# Patient Record
Sex: Female | Born: 1960 | Race: Black or African American | Hispanic: No | Marital: Single | Smoking: Former smoker
Health system: Southern US, Community
[De-identification: ages and names within clinical notes are randomized; demographics above are authoritative.]

## PROBLEM LIST (undated history)

## (undated) DIAGNOSIS — E785 Hyperlipidemia, unspecified: Secondary | ICD-10-CM

## (undated) DIAGNOSIS — I10 Essential (primary) hypertension: Secondary | ICD-10-CM

## (undated) DIAGNOSIS — B019 Varicella without complication: Secondary | ICD-10-CM

## (undated) DIAGNOSIS — L732 Hidradenitis suppurativa: Secondary | ICD-10-CM

## (undated) DIAGNOSIS — D649 Anemia, unspecified: Secondary | ICD-10-CM

## (undated) HISTORY — DX: Essential (primary) hypertension: I10

## (undated) HISTORY — DX: Hyperlipidemia, unspecified: E78.5

## (undated) HISTORY — DX: Varicella without complication: B01.9

## (undated) HISTORY — DX: Hidradenitis suppurativa: L73.2

## (undated) HISTORY — PX: COLONOSCOPY W/ BIOPSIES AND POLYPECTOMY: SHX1376

## (undated) HISTORY — DX: Anemia, unspecified: D64.9

---
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
## 2004-09-11 ENCOUNTER — Other Ambulatory Visit: Admission: RE | Admit: 2004-09-11 | Discharge: 2004-09-11 | Payer: Self-pay | Admitting: Internal Medicine

## 2005-02-24 ENCOUNTER — Encounter: Admission: RE | Admit: 2005-02-24 | Discharge: 2005-02-24 | Payer: Self-pay | Admitting: Internal Medicine

## 2006-12-10 ENCOUNTER — Other Ambulatory Visit: Admission: RE | Admit: 2006-12-10 | Discharge: 2006-12-10 | Payer: Self-pay | Admitting: Internal Medicine

## 2009-02-05 ENCOUNTER — Other Ambulatory Visit: Admission: RE | Admit: 2009-02-05 | Discharge: 2009-02-05 | Payer: Self-pay | Admitting: Internal Medicine

## 2010-05-17 ENCOUNTER — Other Ambulatory Visit: Admission: RE | Admit: 2010-05-17 | Discharge: 2010-05-17 | Payer: Self-pay | Admitting: Internal Medicine

## 2010-05-24 ENCOUNTER — Encounter: Admission: RE | Admit: 2010-05-24 | Discharge: 2010-05-24 | Payer: Self-pay | Admitting: Internal Medicine

## 2010-05-29 ENCOUNTER — Encounter: Admission: RE | Admit: 2010-05-29 | Discharge: 2010-05-29 | Payer: Self-pay | Admitting: Internal Medicine

## 2010-11-26 ENCOUNTER — Encounter: Admission: RE | Admit: 2010-11-26 | Discharge: 2010-11-26 | Payer: Self-pay | Admitting: Internal Medicine

## 2011-06-06 ENCOUNTER — Other Ambulatory Visit: Payer: Self-pay | Admitting: Internal Medicine

## 2011-06-06 DIAGNOSIS — N63 Unspecified lump in unspecified breast: Secondary | ICD-10-CM

## 2011-06-18 ENCOUNTER — Ambulatory Visit
Admission: RE | Admit: 2011-06-18 | Discharge: 2011-06-18 | Disposition: A | Discharge status: Home / Self Care | Payer: 59 | Source: Ambulatory Visit | Attending: Internal Medicine | Admitting: Internal Medicine

## 2011-06-18 DIAGNOSIS — N63 Unspecified lump in unspecified breast: Secondary | ICD-10-CM

## 2011-10-22 ENCOUNTER — Encounter: Payer: Self-pay | Admitting: Family Medicine

## 2011-10-22 ENCOUNTER — Ambulatory Visit (INDEPENDENT_AMBULATORY_CARE_PROVIDER_SITE_OTHER): Payer: 59 | Admitting: Family Medicine

## 2011-10-22 DIAGNOSIS — L732 Hidradenitis suppurativa: Secondary | ICD-10-CM

## 2011-10-22 DIAGNOSIS — E785 Hyperlipidemia, unspecified: Secondary | ICD-10-CM

## 2011-10-22 DIAGNOSIS — R030 Elevated blood-pressure reading, without diagnosis of hypertension: Secondary | ICD-10-CM

## 2011-10-22 MED ORDER — HYDROCHLOROTHIAZIDE 12.5 MG PO CAPS: 12.5000 mg | ORAL_CAPSULE | Freq: Every day | ORAL | Status: DC

## 2011-10-22 NOTE — Progress Notes (Signed)
  Subjective:    Patient ID: Julie Holder, female    DOB: 1960-12-24, 50 y.o.   MRN: [id number]  HPI  New patient to establish care. Patient works as a Agricultural engineer at [employer]. She has medical history significant for recently elevated blood pressure, hyperlipidemia, and recurrent problems with hidradenitis suppurativa. She takes Crestor for hyperlipidemia. Never treated medically for elevated blood pressure. She uses minocycline and topical clindamycin for her recurrent hidradenitis problems. She has hyperlipidemia but no history of peripheral vascular disease or CAD. Quit smoking 2009.  Denies any recent headaches. No dizziness. No chest pain. Recent blood pressures ranging 140-160 systolic and 80-90 diastolic. No regular exercise. Long history of obesity.  Patient is single. She has one child and one grandchild. Quit smoking as above. No alcohol. Works as a Agricultural engineer.  Family history significant for mother with hypertension, type 2 diabetes, chronic kidney disease and history of CHF. Brother with type 2 diabetes.  Past Medical History  Diagnosis Date  . Chicken pox   . Hypertension   . Hyperlipidemia   . Hidradenitis suppurativa    Past Surgical History  Procedure Date  . Hand surgery 1975    reports that she quit smoking about 2 years ago. Her smoking use included Cigarettes. She has a 9 pack-year smoking history. She does not have any smokeless tobacco history on file. Her alcohol and drug histories not on file. family history includes Diabetes in her brother and mother; Heart disease in her mother; Hyperlipidemia in her brother, mother, and sister; Hypertension in her mother; and Kidney disease in her mother. Allergies  Allergen Reactions  . Amoxicillin   . Aspirin   . Fish Allergy     Shell fish  . Penicillins     hives      Review of Systems  Constitutional: Negative for fever, chills, activity change, appetite change and unexpected weight change.    HENT: Negative for ear pain and sore throat.   Eyes: Negative for visual disturbance.  Respiratory: Negative for cough, shortness of breath and wheezing.   Cardiovascular: Negative for chest pain, palpitations and leg swelling.  Gastrointestinal: Negative for nausea, vomiting, abdominal pain, diarrhea, constipation and blood in stool.  Genitourinary: Negative for dysuria.  Musculoskeletal: Negative for back pain.  Skin: Negative for rash.  Neurological: Negative for dizziness, syncope and headaches.  Hematological: Negative for adenopathy.  Psychiatric/Behavioral: Negative for dysphoric mood.       Objective:   Physical Exam  Constitutional: She is oriented to person, place, and time. She appears well-developed and well-nourished.  HENT:  Right Ear: External ear normal.  Left Ear: External ear normal.  Mouth/Throat: Oropharynx is clear and moist.  Neck: Neck supple. No thyromegaly present.  Cardiovascular: Normal rate, regular rhythm and normal heart sounds.   Pulmonary/Chest: Effort normal and breath sounds normal. No respiratory distress. She has no wheezes. She has no rales.  Musculoskeletal: She exhibits no edema.  Lymphadenopathy:    She has no cervical adenopathy.  Neurological: She is alert and oriented to person, place, and time.  Skin: No rash noted.  Psychiatric: She has a normal mood and affect. Her behavior is normal.          Assessment & Plan:  #1 hypertension. New diagnosis. Initiate HCTZ 12.5 mg daily. Potassium rich diet. Sodium reduction. Work on weight loss. Reassess approximately 6 weeks #2 hyperlipidemia. Continue Crestor.  #3 history of recurrent hidradenitis suppurativa

## 2011-10-22 NOTE — Patient Instructions (Signed)
Work on weight loss and more consistent exercise.  Hypertension As your heart beats, it forces blood through your arteries. This force is your blood pressure. If the pressure is too high, it is called hypertension (HTN) or high blood pressure. HTN is dangerous because you may have it and not know it. High blood pressure may mean that your heart has to work harder to pump blood. Your arteries may be narrow or stiff. The extra work puts you at risk for heart disease, stroke, and other problems.  Blood pressure consists of two numbers, a higher number over a lower, 110/72, for example. It is stated as "110 over 72." The ideal is below 120 for the top number (systolic) and under 80 for the bottom (diastolic). Write down your blood pressure today. You should pay close attention to your blood pressure if you have certain conditions such as:  Heart failure.   Prior heart attack.   Diabetes   Chronic kidney disease.   Prior stroke.   Multiple risk factors for heart disease.  To see if you have HTN, your blood pressure should be measured while you are seated with your arm held at the level of the heart. It should be measured at least twice. A one-time elevated blood pressure reading (especially in the Emergency Department) does not mean that you need treatment. There may be conditions in which the blood pressure is different between your right and left arms. It is important to see your caregiver soon for a recheck. Most people have essential hypertension which means that there is not a specific cause. This type of high blood pressure may be lowered by changing lifestyle factors such as:  Stress.   Smoking.   Lack of exercise.   Excessive weight.   Drug/tobacco/alcohol use.   Eating less salt.  Most people do not have symptoms from high blood pressure until it has caused damage to the body. Effective treatment can often prevent, delay or reduce that damage. TREATMENT  When a cause has been  identified, treatment for high blood pressure is directed at the cause. There are a large number of medications to treat HTN. These fall into several categories, and your caregiver will help you select the medicines that are best for you. Medications may have side effects. You should review side effects with your caregiver. If your blood pressure stays high after you have made lifestyle changes or started on medicines,   Your medication(s) may need to be changed.   Other problems may need to be addressed.   Be certain you understand your prescriptions, and know how and when to take your medicine.   Be sure to follow up with your caregiver within the time frame advised (usually within two weeks) to have your blood pressure rechecked and to review your medications.   If you are taking more than one medicine to lower your blood pressure, make sure you know how and at what times they should be taken. Taking two medicines at the same time can result in blood pressure that is too low.  SEEK IMMEDIATE MEDICAL CARE IF:  You develop a severe headache, blurred or changing vision, or confusion.   You have unusual weakness or numbness, or a faint feeling.   You have severe chest or abdominal pain, vomiting, or breathing problems.  MAKE SURE YOU:   Understand these instructions.   Will watch your condition.   Will get help right away if you are not doing well or get worse.  Document Released: 12/08/2005 Document Revised: 08/20/2011 Document Reviewed: 07/28/2008 Exit[employer] Patient Information 2012 [employer]nd more consistent exercise.

## 2011-11-07 ENCOUNTER — Encounter: Payer: Self-pay | Admitting: Family Medicine

## 2011-12-24 ENCOUNTER — Encounter: Payer: Self-pay | Admitting: Family Medicine

## 2011-12-24 ENCOUNTER — Ambulatory Visit (INDEPENDENT_AMBULATORY_CARE_PROVIDER_SITE_OTHER): Payer: 59 | Admitting: Family Medicine

## 2011-12-24 VITALS — BP 150/80 | Temp 98.4°F | Wt 260.0 lb

## 2011-12-24 DIAGNOSIS — E785 Hyperlipidemia, unspecified: Secondary | ICD-10-CM

## 2011-12-24 DIAGNOSIS — R252 Cramp and spasm: Secondary | ICD-10-CM

## 2011-12-24 DIAGNOSIS — R030 Elevated blood-pressure reading, without diagnosis of hypertension: Secondary | ICD-10-CM

## 2011-12-24 LAB — BASIC METABOLIC PANEL
BUN: 21 mg/dL (ref 6–23)
CO2: 26 mEq/L (ref 19–32)
Chloride: 100 mEq/L (ref 96–112)
Creatinine, Ser: 0.9 mg/dL (ref 0.4–1.2)
Potassium: 3.7 mEq/L (ref 3.5–5.1)

## 2011-12-24 MED ORDER — LISINOPRIL-HYDROCHLOROTHIAZIDE 10-12.5 MG PO TABS: 1.0000 | ORAL_TABLET | Freq: Every day | ORAL | Status: DC

## 2011-12-24 NOTE — Patient Instructions (Signed)
Continue with weight loss efforts.  

## 2011-12-24 NOTE — Progress Notes (Signed)
  Subjective:    Patient ID: Julie Holder, female    DOB: April 18, 1961, 51 y.o.   MRN: [id number]  HPI   Patient's history of obesity, hypertension, hyperlipidemia, and hidradenitis. Continues to see dermatologist. They're considering use of Accutane. Maj. issue is frequent muscle cramps mostly at night. Takes hydrochlorothiazide for hypertension. No recent lab work. Patient concerned this is due to Crestor. No myalgias or arthralgias. Drinks plenty of fluids. Frequent intake of fruit. No claudication symptoms.  Patient also has had prior intolerance of Lipitor with myalgias.  Past Medical History  Diagnosis Date  . Chicken pox   . Hypertension   . Hyperlipidemia   . Hidradenitis suppurativa    Past Surgical History  Procedure Date  . Hand surgery 1975    reports that she quit smoking about 2 years ago. Her smoking use included Cigarettes. She has a 9 pack-year smoking history. She does not have any smokeless tobacco history on file. Her alcohol and drug histories not on file. family history includes Diabetes in her brother and mother; Heart disease in her mother; Hyperlipidemia in her brother, mother, and sister; Hypertension in her mother; and Kidney disease in her mother. Allergies  Allergen Reactions  . Amoxicillin   . Aspirin   . Fish Allergy     Shell fish  . Penicillins     hives      Review of Systems  Constitutional: Negative for fever, chills, appetite change and unexpected weight change.  Respiratory: Negative for cough and shortness of breath.   Cardiovascular: Negative for chest pain, palpitations and leg swelling.  Gastrointestinal: Negative for abdominal pain.  Neurological: Negative for dizziness and headaches.       Objective:   Physical Exam  Constitutional: She appears well-developed and well-nourished. No distress.  Neck: Neck supple.  Cardiovascular: Normal rate and regular rhythm.   Pulmonary/Chest: Effort normal and breath sounds normal. No  respiratory distress. She has no wheezes. She has no rales.  Musculoskeletal: She exhibits no edema.  Lymphadenopathy:    She has no cervical adenopathy.  Psychiatric: She has a normal mood and affect.          Assessment & Plan:  #1 hypertension poorly controlled. Change HCTZ to lisinopril HCTZ 10/12.5 one daily and reassess blood pressure in 2 months #2 frequent leg cramps. Check basic metabolic panel and magnesium level. If normal, consider holding Crestor for now #3 hyperlipidemia.  Doubt leg cramps related. If labs normal consider holding Crestor for one month to see his symptoms resolve. She has already been intolerant to Lipitor

## 2011-12-25 NOTE — Progress Notes (Signed)
Quick Note:  Left a message for pt to return call. ______ 

## 2011-12-26 NOTE — Progress Notes (Signed)
Quick Note:  Labs mailed to pt home, also VM left with recommendations ______

## 2012-01-12 ENCOUNTER — Encounter: Payer: Self-pay | Admitting: *Deleted

## 2012-01-12 ENCOUNTER — Encounter: Payer: 59 | Attending: Family Medicine | Admitting: *Deleted

## 2012-01-12 DIAGNOSIS — E669 Obesity, unspecified: Secondary | ICD-10-CM | POA: Insufficient documentation

## 2012-01-12 DIAGNOSIS — Z713 Dietary counseling and surveillance: Secondary | ICD-10-CM | POA: Insufficient documentation

## 2012-01-12 DIAGNOSIS — I10 Essential (primary) hypertension: Secondary | ICD-10-CM | POA: Insufficient documentation

## 2012-01-12 DIAGNOSIS — E785 Hyperlipidemia, unspecified: Secondary | ICD-10-CM | POA: Insufficient documentation

## 2012-01-12 NOTE — Patient Instructions (Addendum)
Plan: Continue with choosing lean meats prepared without added fat  Continue with multiple choices of vegetables and fruits / day Aim for 3 Carb Choices per meal, 0-1 choice per snack if hungry Read Food Labels for Total Carbohydrate and Fat grams of foods Look for opportunities to increase activity level as tolerated on days off work

## 2012-01-12 NOTE — Progress Notes (Signed)
  Medical Nutrition Therapy:  Appt start time: 1200 end time:  1300.   Assessment:  Primary concerns today: Patient states she has hypertension and would like to lose weight. 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 She states she has a problem with her sweat glands that flares up more when she is over tired, so she tries to rest more on her days off.   MEDICATIONS: see list   DIETARY INTAKE:  Usual eating pattern includes 3 meals and 1-2 snacks per day.  Everyday foods include good variety of all food groups.  Avoided foods include fried and high fat foods now and most sweets.    24-hr recall:  B ( AM): was eating bacon egg and cheese to: oats, banana and small orange Snk ( AM): fresh fruit  L ( PM): salad with raspberry walnut dressing and lean meat, OR sandwich ([employer]) white bread, lite mayo, water  Snk ( PM):  no D ( PM): weaning off of fried foods to 1/week, meat, 2 vegetables, sweet vs white potato, oleo with cinnamon and Splenda Snk ( PM): every other night, snack with grand daughter Beverages: water, coffee w/ non fat creamer and tea w/ honey or Equal,   Usual physical activity:  Moves a lot at work 3 days/week, not much on days off. Has problem with infected sweat glands so needs to rest if gets too tired. Does house and yard work as needed.  Estimated energy needs: 1400 calories 158 g carbohydrates 105 g protein 39 g fat  Progress Towards Goal(s):  In progress.   Nutritional Diagnosis:  NI-1.5 Excessive energy intake As related to weight management.  As evidenced by BMI of 43.2.    Intervention:  Nutrition counseling on basic nutrition, relationship of macro-nutrients to calorie and . Plan: Continue with choosing lean meats prepared without added fat  Continue with multiple choices of vegetables and fruits / day Aim for 3 Carb Choices per meal, 0-1 choice per snack if hungry Read Food  Labels for Total Carbohydrate and Fat grams of foods Look for opportunities to increase activity level as tolerated on days off work  Handouts given during visit include:  Carb Counting and Label Reading handouts  Menu Planning Card  Carb counting and Meal Planning Booklet by [employer]  Monitoring/Evaluation:  Dietary intake, exercise, reading food labels, and body weight in 1 month(s).

## 2012-02-20 ENCOUNTER — Encounter: Payer: Self-pay | Admitting: Family Medicine

## 2012-02-20 ENCOUNTER — Ambulatory Visit (INDEPENDENT_AMBULATORY_CARE_PROVIDER_SITE_OTHER): Payer: 59 | Admitting: Family Medicine

## 2012-02-20 DIAGNOSIS — R030 Elevated blood-pressure reading, without diagnosis of hypertension: Secondary | ICD-10-CM

## 2012-02-20 DIAGNOSIS — E785 Hyperlipidemia, unspecified: Secondary | ICD-10-CM

## 2012-02-20 MED ORDER — PRAVASTATIN SODIUM 20 MG PO TABS: 20.0000 mg | ORAL_TABLET | Freq: Every day | ORAL | Status: DC

## 2012-02-20 NOTE — Progress Notes (Signed)
  Subjective:    Patient ID: Tana Felts, female    DOB: 21-Jun-1961, 51 y.o.   MRN: [id number]  HPI  Followup hypertension. Blood pressure greatly improved with lisinopril HCTZ combination. No dizziness or side effects.   On potassium rich diet.  Muscle cramps and aches resolved after she stopped Crestor. She's had no muscle aches whatsoever. She has also had previous intolerance to Lipitor.   Review of Systems  Constitutional: Negative for fatigue.  Eyes: Negative for visual disturbance.  Respiratory: Negative for cough, chest tightness, shortness of breath and wheezing.   Cardiovascular: Negative for chest pain, palpitations and leg swelling.  Neurological: Negative for dizziness, seizures, syncope, weakness, light-headedness and headaches.       Objective:   Physical Exam  Constitutional: She appears well-developed and well-nourished.  Cardiovascular: Normal rate and regular rhythm.   Pulmonary/Chest: Effort normal and breath sounds normal. No respiratory distress. She has no wheezes. She has no rales.  Musculoskeletal: She exhibits no edema.          Assessment & Plan:  #1 hypertension improved continue current medication. Check basic metabolic panel followup in 2 months #2 muscle cramps improved and resolved off Crestor. Change to pravastatin 20 mg daily and if tolerating- lipid panel and hepatic panel in 2 months

## 2012-04-21 ENCOUNTER — Other Ambulatory Visit (INDEPENDENT_AMBULATORY_CARE_PROVIDER_SITE_OTHER): Payer: 59

## 2012-04-21 DIAGNOSIS — R030 Elevated blood-pressure reading, without diagnosis of hypertension: Secondary | ICD-10-CM

## 2012-04-21 DIAGNOSIS — E785 Hyperlipidemia, unspecified: Secondary | ICD-10-CM

## 2012-04-21 LAB — HEPATIC FUNCTION PANEL
ALT: 16 U/L (ref 0–35)
Bilirubin, Direct: 0 mg/dL (ref 0.0–0.3)
Total Bilirubin: 0.4 mg/dL (ref 0.3–1.2)
Total Protein: 8.5 g/dL — ABNORMAL HIGH (ref 6.0–8.3)

## 2012-04-21 LAB — LIPID PANEL
Cholesterol: 193 mg/dL (ref 0–200)
HDL: 25.4 mg/dL — ABNORMAL LOW (ref >39.00)
LDL Cholesterol: 148 mg/dL — ABNORMAL HIGH (ref 0–99)
VLDL: 19.4 mg/dL (ref 0.0–40.0)

## 2012-04-21 LAB — BASIC METABOLIC PANEL
BUN: 29 mg/dL — ABNORMAL HIGH (ref 6–23)
Chloride: 102 mEq/L (ref 96–112)
GFR: 65.87 mL/min (ref >60.00)
Potassium: 4.4 mEq/L (ref 3.5–5.1)
Sodium: 136 mEq/L (ref 135–145)

## 2012-04-22 NOTE — Progress Notes (Signed)
Quick Note:  Pt informed ______ 

## 2012-06-23 ENCOUNTER — Other Ambulatory Visit: Payer: Self-pay | Admitting: Family Medicine

## 2012-06-23 DIAGNOSIS — Z1231 Encounter for screening mammogram for malignant neoplasm of breast: Secondary | ICD-10-CM

## 2012-07-09 ENCOUNTER — Ambulatory Visit
Admission: RE | Admit: 2012-07-09 | Discharge: 2012-07-09 | Disposition: A | Discharge status: Home / Self Care | Payer: 59 | Source: Ambulatory Visit | Attending: Family Medicine | Admitting: Family Medicine

## 2012-07-09 DIAGNOSIS — Z1231 Encounter for screening mammogram for malignant neoplasm of breast: Secondary | ICD-10-CM

## 2012-07-12 ENCOUNTER — Ambulatory Visit (INDEPENDENT_AMBULATORY_CARE_PROVIDER_SITE_OTHER): Payer: 59 | Admitting: Family Medicine

## 2012-07-12 ENCOUNTER — Encounter: Payer: Self-pay | Admitting: Family Medicine

## 2012-07-12 VITALS — BP 130/70 | Temp 98.1°F | Wt 247.0 lb

## 2012-07-12 DIAGNOSIS — R209 Unspecified disturbances of skin sensation: Secondary | ICD-10-CM

## 2012-07-12 DIAGNOSIS — R202 Paresthesia of skin: Secondary | ICD-10-CM

## 2012-07-12 NOTE — Patient Instructions (Addendum)
Follow up promptly for any weakness in left lower extremity or any progressive numbness or severe low back pain.

## 2012-07-12 NOTE — Progress Notes (Signed)
  Subjective:    Patient ID: Julie Holder, female    DOB: 1961/06/01, 51 y.o.   MRN: [id number]  HPI  Acute visit. Numbness left lateral knee region for one and one half months. No weakness. No known injury. No low back pain. No other areas of numbness. No aggravating factors. No alleviating factors.  She has history of hidradenitis suppurativa. Followed by dermatology. Recently started on Septra and prednisone and that is clearing up well.   Review of Systems  Constitutional: Negative for fever and chills.  Genitourinary: Negative for dysuria.  Neurological: Positive for numbness. Negative for weakness.       Objective:   Physical Exam  Constitutional: She appears well-developed and well-nourished.  Cardiovascular: Normal rate and regular rhythm.   Pulmonary/Chest: Effort normal and breath sounds normal. No respiratory distress. She has no wheezes. She has no rales.  Musculoskeletal: She exhibits no edema.       No muscle atrophy in lower extremities  Neurological:       Full-strength lower extremities. Symmetric lower extremity reflexes. Subjective mild impairment touch left lateral knee region just lateral and superior to patellar          Assessment & Plan:  Very localized paresthesias left thigh with otherwise nonfocal exam. We discussed other evaluation including MRI or nerve conduction studies at this point she has no weakness and no pain so we've recommended observation. Be in touch for any weakness or progressive pain

## 2012-11-29 ENCOUNTER — Other Ambulatory Visit: Payer: Self-pay | Admitting: Family Medicine

## 2013-02-05 ENCOUNTER — Other Ambulatory Visit: Payer: Self-pay

## 2013-04-15 ENCOUNTER — Ambulatory Visit (INDEPENDENT_AMBULATORY_CARE_PROVIDER_SITE_OTHER): Payer: 59 | Admitting: Family Medicine

## 2013-04-15 ENCOUNTER — Other Ambulatory Visit (HOSPITAL_COMMUNITY)
Admission: RE | Admit: 2013-04-15 | Discharge: 2013-04-15 | Disposition: A | Discharge status: Home / Self Care | Payer: 59 | Source: Ambulatory Visit | Attending: Family Medicine | Admitting: Family Medicine

## 2013-04-15 ENCOUNTER — Encounter: Payer: Self-pay | Admitting: Family Medicine

## 2013-04-15 VITALS — BP 130/68 | HR 72 | Temp 98.4°F | Resp 12 | Ht 65.5 in | Wt 259.0 lb

## 2013-04-15 DIAGNOSIS — Z01419 Encounter for gynecological examination (general) (routine) without abnormal findings: Secondary | ICD-10-CM | POA: Insufficient documentation

## 2013-04-15 DIAGNOSIS — Z23 Encounter for immunization: Secondary | ICD-10-CM

## 2013-04-15 DIAGNOSIS — Z0000 Encounter for general adult medical examination without abnormal findings: Secondary | ICD-10-CM

## 2013-04-15 LAB — HEPATIC FUNCTION PANEL
ALT: 29 U/L (ref 0–35)
AST: 21 U/L (ref 0–37)
Alkaline Phosphatase: 68 U/L (ref 39–117)
Bilirubin, Direct: 0 mg/dL (ref 0.0–0.3)
Total Bilirubin: 0.3 mg/dL (ref 0.3–1.2)

## 2013-04-15 LAB — CBC WITH DIFFERENTIAL/PLATELET
Basophils Relative: 0.5 % (ref 0.0–3.0)
Eosinophils Relative: 2.9 % (ref 0.0–5.0)
MCV: 83.3 fl (ref 78.0–100.0)
Monocytes Absolute: 0.6 10*3/uL (ref 0.1–1.0)
Monocytes Relative: 7.7 % (ref 3.0–12.0)
Neutrophils Relative %: 53 % (ref 43.0–77.0)
Platelets: 366 10*3/uL (ref 150.0–400.0)
RBC: 3.62 Mil/uL — ABNORMAL LOW (ref 3.87–5.11)
WBC: 8.4 10*3/uL (ref 4.5–10.5)

## 2013-04-15 LAB — BASIC METABOLIC PANEL
CO2: 25 mEq/L (ref 19–32)
Calcium: 8.9 mg/dL (ref 8.4–10.5)
Chloride: 103 mEq/L (ref 96–112)
Glucose, Bld: 79 mg/dL (ref 70–99)
Sodium: 135 mEq/L (ref 135–145)

## 2013-04-15 LAB — POCT URINALYSIS DIPSTICK
Bilirubin, UA: NEGATIVE
Blood, UA: NEGATIVE
Ketones, UA: NEGATIVE
Protein, UA: NEGATIVE
Spec Grav, UA: 1.01
pH, UA: 6

## 2013-04-15 LAB — LIPID PANEL
Cholesterol: 229 mg/dL — ABNORMAL HIGH (ref 0–200)
HDL: 30.2 mg/dL — ABNORMAL LOW (ref >39.00)
Total CHOL/HDL Ratio: 8
Triglycerides: 93 mg/dL (ref 0.0–149.0)
VLDL: 18.6 mg/dL (ref 0.0–40.0)

## 2013-04-15 LAB — TSH: TSH: 0.89 u[IU]/mL (ref 0.35–5.50)

## 2013-04-15 NOTE — Patient Instructions (Addendum)
Try to lose some weight. We will call you regarding colonoscopy and pap smear results Continue with yearly mammogram.

## 2013-04-15 NOTE — Progress Notes (Signed)
Subjective:    Patient ID: Julie Holder, female    DOB: 06-Jan-1961, 52 y.o.   MRN: [id number]  HPI Patient here for complete physical She has history of hyperlipidemia, obesity, hypertension. Also has history of recurrent problems with hidradenitis suppurativa. She is seen dermatologist for this. Last mammogram was last summer. Has never had screening colonoscopy. Last Pap smear about 3 years ago. She is due for repeat tetanus. Has not had lab work yet.  Past Medical History  Diagnosis Date  . Chicken pox   . Hypertension   . Hyperlipidemia   . Hidradenitis suppurativa    Past Surgical History  Procedure Laterality Date  . Hand surgery  1975    reports that she quit smoking about 4 years ago. Her smoking use included Cigarettes. She has a 9 pack-year smoking history. She does not have any smokeless tobacco history on file. Her alcohol and drug histories are not on file. family history includes Diabetes in her brother and mother; Heart disease in her mother; Hyperlipidemia in her brother, mother, and sister; Hypertension in her mother; and Kidney disease in her mother. Allergies  Allergen Reactions  . Amoxicillin   . Aspirin   . Claravis (Isotretinoin)     Muscle spasms  . Fish Allergy     Shell fish  . Penicillins     hives      Review of Systems  Constitutional: Negative for fever, activity change, appetite change, fatigue and unexpected weight change.  HENT: Negative for hearing loss, ear pain, sore throat and trouble swallowing.   Eyes: Negative for visual disturbance.  Respiratory: Negative for cough and shortness of breath.   Cardiovascular: Negative for chest pain and palpitations.  Gastrointestinal: Negative for abdominal pain, diarrhea, constipation and blood in stool.  Genitourinary: Negative for dysuria and hematuria.  Musculoskeletal: Negative for myalgias, back pain and arthralgias.  Skin: Negative for rash.  Neurological: Negative for dizziness,  syncope and headaches.  Hematological: Negative for adenopathy.  Psychiatric/Behavioral: Negative for confusion and dysphoric mood.       Objective:   Physical Exam  Constitutional: She is oriented to person, place, and time. She appears well-developed and well-nourished.  HENT:  Head: Normocephalic and atraumatic.  Eyes: EOM are normal. Pupils are equal, round, and reactive to light.  Neck: Normal range of motion. Neck supple. No thyromegaly present.  Cardiovascular: Normal rate, regular rhythm and normal heart sounds.   No murmur heard. Pulmonary/Chest: Breath sounds normal. No respiratory distress. She has no wheezes. She has no rales.  Abdominal: Soft. Bowel sounds are normal. She exhibits no distension and no mass. There is no tenderness. There is no rebound and no guarding.  Genitourinary:  Breasts are symmetric. No mass. Pelvic exam normal external genitalia. Cervix normal in appearance. No vaginal discharge. Pap smear obtained. Bimanual exam difficult because of her obesity. No adnexal masses or tenderness noted.  Musculoskeletal: Normal range of motion. She exhibits no edema.  Lymphadenopathy:    She has no cervical adenopathy.  Neurological: She is alert and oriented to person, place, and time. She displays normal reflexes. No cranial nerve deficit.  Skin: Rash noted.  Patient has several macular hyperpigmented scattered areas across her abdomen and chest wall. No pustules.  Psychiatric: She has a normal mood and affect. Her behavior is normal. Judgment and thought content normal.          Assessment & Plan:  Complete physical. Screening lab work obtained. Tetanus booster given. Schedule screening colonoscopy. Pap smear  obtained. She will continue with yearly mammograms.

## 2013-04-19 ENCOUNTER — Other Ambulatory Visit: Payer: Self-pay | Admitting: *Deleted

## 2013-04-19 DIAGNOSIS — D649 Anemia, unspecified: Secondary | ICD-10-CM

## 2013-04-19 MED ORDER — PRAVASTATIN SODIUM 40 MG PO TABS: 40.0000 mg | ORAL_TABLET | Freq: Every day | ORAL | Status: DC

## 2013-04-19 NOTE — Progress Notes (Signed)
Quick Note:  Pt informed, labs ordered, med increased ______

## 2013-04-20 NOTE — Progress Notes (Signed)
Quick Note:  Pt informed "normal test" on home VM ______

## 2013-04-21 ENCOUNTER — Encounter: Payer: Self-pay | Admitting: Family Medicine

## 2013-04-22 ENCOUNTER — Other Ambulatory Visit (INDEPENDENT_AMBULATORY_CARE_PROVIDER_SITE_OTHER): Payer: 59

## 2013-04-22 DIAGNOSIS — D649 Anemia, unspecified: Secondary | ICD-10-CM

## 2013-04-22 LAB — VITAMIN B12: Vitamin B-12: 974 pg/mL — ABNORMAL HIGH (ref 211–911)

## 2013-04-23 LAB — IRON AND TIBC
Iron: 29 ug/dL — ABNORMAL LOW (ref 42–145)
TIBC: 356 ug/dL (ref 250–470)
UIBC: 327 ug/dL (ref 125–400)

## 2013-04-25 ENCOUNTER — Encounter: Payer: Self-pay | Admitting: Gastroenterology

## 2013-04-25 NOTE — Progress Notes (Signed)
Quick Note:  Pt informed and she voiced understanding, has pre-op in May and colonscopy mid June. Dr Caryl Never informed. ______

## 2013-05-05 ENCOUNTER — Ambulatory Visit (AMBULATORY_SURGERY_CENTER): Payer: 59 | Admitting: *Deleted

## 2013-05-05 VITALS — Ht 65.5 in | Wt 259.2 lb

## 2013-05-05 DIAGNOSIS — Z1211 Encounter for screening for malignant neoplasm of colon: Secondary | ICD-10-CM

## 2013-05-05 MED ORDER — MOVIPREP 100 G PO SOLR: 1.0000 | Freq: Once | ORAL | Status: DC

## 2013-05-05 NOTE — Progress Notes (Signed)
No egg or soy allergy. ewm No problems with past sedations. ewm No 02 use at home. ewm No previous Gi history. ewm

## 2013-05-09 ENCOUNTER — Other Ambulatory Visit: Payer: Self-pay | Admitting: Family Medicine

## 2013-05-18 ENCOUNTER — Encounter: Payer: Self-pay | Admitting: Family Medicine

## 2013-05-18 MED ORDER — PRAVASTATIN SODIUM 40 MG PO TABS: 40.0000 mg | ORAL_TABLET | Freq: Every day | ORAL | Status: DC

## 2013-05-25 ENCOUNTER — Ambulatory Visit (AMBULATORY_SURGERY_CENTER): Payer: 59 | Admitting: Gastroenterology

## 2013-05-25 ENCOUNTER — Encounter: Payer: Self-pay | Admitting: Gastroenterology

## 2013-05-25 VITALS — BP 121/76 | HR 62 | Temp 97.9°F | Resp 16 | Ht 65.0 in | Wt 259.0 lb

## 2013-05-25 DIAGNOSIS — D126 Benign neoplasm of colon, unspecified: Secondary | ICD-10-CM

## 2013-05-25 DIAGNOSIS — Z1211 Encounter for screening for malignant neoplasm of colon: Secondary | ICD-10-CM

## 2013-05-25 DIAGNOSIS — K5730 Diverticulosis of large intestine without perforation or abscess without bleeding: Secondary | ICD-10-CM

## 2013-05-25 MED ORDER — SODIUM CHLORIDE 0.9 % IV SOLN: 500.0000 mL | INTRAVENOUS | Status: DC

## 2013-05-25 NOTE — Op Note (Signed)
[facility] [address] [place], [address]   COLONOSCOPY PROCEDURE REPORT  PATIENT: Julie, Holder  MR#: [id number] BIRTHDATE: 09-08-1961 , 51  yrs. old GENDER: Female ENDOSCOPIST: Mardella Layman, MD, Clementeen Graham REFERRED BY:  Evelena Peat, M.D. PROCEDURE DATE:  05/25/2013 PROCEDURE:   Colonoscopy with cold biopsy polypectomy ASA CLASS:   Class II INDICATIONS:average risk screening and first colonoscopy. MEDICATIONS: propofol (Diprivan) 350mg  IV  DESCRIPTION OF PROCEDURE:   After the risks and benefits and of the procedure were explained, informed consent was obtained.  A digital rectal exam revealed no abnormalities of the rectum.    The [id number]  endoscope was introduced through the anus and advanced to the cecum, which was identified by both the appendix and ileocecal valve .  The quality of the prep was excellent, using MoviPrep .  The instrument was then slowly withdrawn as the colon was fully examined.     COLON FINDINGS: Mild diverticulosis was noted in the sigmoid colon. A polypoid shaped flat polyp ranging between 3-78mm in size was found at the cecum.  A polypectomy was performed with a cold snare and with cold forceps.  The resection was complete and the polyp tissue was completely retrieved.   Two diminutive smooth sessile polyps were found in the rectum.  Multiple biopsies were performed using cold forceps.     Retroflexed views revealed no abnormalities.     The scope was then withdrawn from the patient and the procedure completed.  COMPLICATIONS: There were no complications. ENDOSCOPIC IMPRESSION: 1.   Mild diverticulosis was noted in the sigmoid colon 2.   Flat polyp ranging between 3-17mm in size was found at the cecum; polypectomy was performed with a cold snare and with cold forceps 3.   Two diminutive sessile polyps were found in the rectum; multiple biopsies were performed using cold forceps  RECOMMENDATIONS: 1.  Await  pathology results 2.  High fiber diet 3.  Repeat colonoscopy in 5 years if polyp adenomatous; otherwise 10 years   REPEAT EXAM:  cc:  _______________________________ eSignedMardella Layman, MD, [facility] 05/25/2013 10:26 AM     PATIENT NAME:  Julie, Holder MR#: [id number]

## 2013-05-25 NOTE — Progress Notes (Signed)
Called to room to assist during endoscopic procedure.  Patient ID and intended procedure confirmed with present staff. Received instructions for my participation in the procedure from the performing physician.  

## 2013-05-25 NOTE — Progress Notes (Signed)
Patient to restroom prior to leaving recovery.

## 2013-05-25 NOTE — Progress Notes (Signed)
Patient did not experience any of the following events: a burn prior to discharge; a fall within the facility; wrong site/side/patient/procedure/implant event; or a hospital transfer or hospital admission upon discharge from the facility. (G8907) Patient did not have preoperative order for IV antibiotic SSI prophylaxis. ([id number])  

## 2013-05-25 NOTE — Patient Instructions (Addendum)

## 2013-05-26 ENCOUNTER — Telehealth: Payer: Self-pay | Admitting: *Deleted

## 2013-05-26 NOTE — Telephone Encounter (Signed)
  Follow up Call-  Call back number 05/25/2013  Post procedure Call Back phone  # [phone]  Permission to leave phone message Yes     Patient questions:  Message left to call if necessary.

## 2013-05-30 ENCOUNTER — Encounter: Payer: Self-pay | Admitting: Gastroenterology

## 2013-06-03 ENCOUNTER — Other Ambulatory Visit: Payer: Self-pay | Admitting: Family Medicine

## 2013-06-30 ENCOUNTER — Encounter: Payer: Self-pay | Admitting: Family Medicine

## 2013-07-15 ENCOUNTER — Other Ambulatory Visit: Payer: Self-pay

## 2013-07-15 DIAGNOSIS — Z1231 Encounter for screening mammogram for malignant neoplasm of breast: Secondary | ICD-10-CM

## 2013-08-12 ENCOUNTER — Ambulatory Visit
Admission: RE | Admit: 2013-08-12 | Discharge: 2013-08-12 | Disposition: A | Discharge status: Home / Self Care | Payer: 59 | Source: Ambulatory Visit

## 2013-08-12 DIAGNOSIS — Z1231 Encounter for screening mammogram for malignant neoplasm of breast: Secondary | ICD-10-CM

## 2013-08-16 ENCOUNTER — Other Ambulatory Visit: Payer: Self-pay | Admitting: Family Medicine

## 2013-08-16 DIAGNOSIS — R928 Other abnormal and inconclusive findings on diagnostic imaging of breast: Secondary | ICD-10-CM

## 2013-09-09 ENCOUNTER — Ambulatory Visit
Admission: RE | Admit: 2013-09-09 | Discharge: 2013-09-09 | Disposition: A | Discharge status: Home / Self Care | Payer: 59 | Source: Ambulatory Visit | Attending: Family Medicine | Admitting: Family Medicine

## 2013-09-09 ENCOUNTER — Ambulatory Visit (INDEPENDENT_AMBULATORY_CARE_PROVIDER_SITE_OTHER): Payer: 59 | Admitting: Family Medicine

## 2013-09-09 ENCOUNTER — Encounter: Payer: Self-pay | Admitting: Family Medicine

## 2013-09-09 VITALS — BP 110/60 | HR 87 | Temp 98.0°F | Wt 265.0 lb

## 2013-09-09 DIAGNOSIS — S335XXA Sprain of ligaments of lumbar spine, initial encounter: Secondary | ICD-10-CM

## 2013-09-09 DIAGNOSIS — R928 Other abnormal and inconclusive findings on diagnostic imaging of breast: Secondary | ICD-10-CM

## 2013-09-09 DIAGNOSIS — S39012A Strain of muscle, fascia and tendon of lower back, initial encounter: Secondary | ICD-10-CM

## 2013-09-09 MED ORDER — MELOXICAM 15 MG PO TABS: 15.0000 mg | ORAL_TABLET | Freq: Every day | ORAL | Status: DC

## 2013-09-09 NOTE — Patient Instructions (Signed)

## 2013-09-09 NOTE — Progress Notes (Signed)
  Subjective:    Patient ID: Julie Holder, female    DOB: 23-Nov-1961, 52 y.o.   MRN: [id number]  HPI Patient seen with low back pain. Onset 1 week ago today. 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 Denies radiculopathy pain. No numbness or weakness. She's taken some ibuprofen with minimal relief. Pain is worse with position change. Denies any loss of urine or bowel control.  She went to employee health and was told to take ibuprofen and given some stretches which she has not yet done. She is alternating heat and ice with some relief.  Past Medical History  Diagnosis Date  . Chicken pox   . Hypertension   . Hyperlipidemia   . Hidradenitis suppurativa    Past Surgical History  Procedure Laterality Date  . Hand surgery  1975    reports that she quit smoking about 4 years ago. Her smoking use included Cigarettes. She has a 9 pack-year smoking history. She has never used smokeless tobacco. She reports that she does not drink alcohol or use illicit drugs. family history includes Cancer in her brother and father; Diabetes in her brother and mother; Heart disease in her mother; Hyperlipidemia in her brother, mother, and sister; Hypertension in her mother; Kidney disease in her mother. There is no history of Colon cancer. Allergies  Allergen Reactions  . Amoxicillin Shortness Of Breath  . Claravis [Isotretinoin]     Muscle spasms  . Fish Allergy     Shell fish  . Penicillins     hives  . Statins Other (See Comments)    Muscle spasms  . Aspirin Other (See Comments)      Review of Systems  Constitutional: Negative for fever, chills, appetite change and unexpected weight change.  Gastrointestinal: Negative for abdominal pain.  Genitourinary: Negative for dysuria.  Musculoskeletal: Positive for back pain.  Neurological: Negative for weakness and numbness.       Objective:    Physical Exam  Constitutional: She appears well-developed and well-nourished.  Cardiovascular: Normal rate.   Pulmonary/Chest: Effort normal and breath sounds normal. No respiratory distress. She has no wheezes. She has no rales.  Musculoskeletal: She exhibits no edema.  Straight leg raise are negative. She has minimal tenderness mid lower lumbar area.  Neurological:  Full-strength lower extremities with symmetric reflexes          Assessment & Plan:  Acute lumbar back strain. Recommend Mobic 15 mg once daily. Discussed stretches. Light duty with no bending or stooping and no lifting over 20 pounds for the next 2 weeks. Consider physical therapy if no better in 2 weeks.

## 2013-09-14 ENCOUNTER — Encounter: Payer: Self-pay | Admitting: Family Medicine

## 2013-09-23 ENCOUNTER — Ambulatory Visit (INDEPENDENT_AMBULATORY_CARE_PROVIDER_SITE_OTHER): Payer: 59 | Admitting: Family Medicine

## 2013-09-23 ENCOUNTER — Encounter: Payer: Self-pay | Admitting: Family Medicine

## 2013-09-23 VITALS — BP 122/68 | HR 74 | Temp 98.0°F | Wt 264.0 lb

## 2013-09-23 DIAGNOSIS — M545 Low back pain, unspecified: Secondary | ICD-10-CM

## 2013-09-23 NOTE — Progress Notes (Signed)
  Subjective:    Patient ID: Julie Holder, female    DOB: 1961-06-12, 52 y.o.   MRN: [id number]  HPI Patient seen for followup low back pain. 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 Refer to original note. We suspected lumbosacral back strain. She had nonfocal neurologic exam. We prescribed meloxicam she states her back is about 40% better. She's been on restricted duty with no bending, lifting, or stooping. Her back pain is mostly with movement -mostly left lumbar lower lumbar area with some radiation bilaterally. No radiculopathy symptoms. No lower extremity numbness or weakness. No prior history of significant back difficulties.  Past Medical History  Diagnosis Date  . Chicken pox   . Hypertension   . Hyperlipidemia   . Hidradenitis suppurativa    Past Surgical History  Procedure Laterality Date  . Hand surgery  1975    reports that she quit smoking about 4 years ago. Her smoking use included Cigarettes. She has a 9 pack-year smoking history. She has never used smokeless tobacco. She reports that she does not drink alcohol or use illicit drugs. family history includes Cancer in her brother and father; Diabetes in her brother and mother; Heart disease in her mother; Hyperlipidemia in her brother, mother, and sister; Hypertension in her mother; Kidney disease in her mother. There is no history of Colon cancer. Allergies  Allergen Reactions  . Amoxicillin Shortness Of Breath  . Claravis [Isotretinoin]     Muscle spasms  . Fish Allergy     Shell fish  . Penicillins     hives  . Statins Other (See Comments)    Muscle spasms  . Aspirin Other (See Comments)      Review of Systems  Constitutional: Negative for fever and chills.  Respiratory: Negative for shortness of breath.   Cardiovascular: Negative for chest pain.  Genitourinary: Negative for dysuria.  Musculoskeletal: Positive for back pain.       Objective:   Physical Exam  Constitutional: She appears  well-developed and well-nourished.  Cardiovascular: Normal rate and regular rhythm.   Pulmonary/Chest: Effort normal and breath sounds normal. No respiratory distress. She has no wheezes. She has no rales.  Musculoskeletal: She exhibits no edema.  Straight leg raises are negative bilaterally  Neurological:  Full-strength lower extremities. Symmetric reflexes knee and ankle bilaterally. Normal sensory function          Assessment & Plan:  Lumbar back pain. Suspect musculoskeletal strain. Nonfocal neurologic exam. Slightly improved. Continue meloxicam. Set up physical therapy for stretches and strengthening.  We have recommended continue light duty for another 2 weeks.

## 2013-09-23 NOTE — Patient Instructions (Signed)
Lumbosacral Strain Lumbosacral strain is one of the most common causes of back pain. There are many causes of back pain. Most are not serious conditions. CAUSES  Your backbone (spinal column) is made up of 24 main vertebral bodies, the sacrum, and the coccyx. These are held together by muscles and tough, fibrous tissue (ligaments). Nerve roots pass through the openings between the vertebrae. A sudden move or injury to the back may cause injury to, or pressure on, these nerves. This may result in localized back pain or pain movement (radiation) into the buttocks, down the leg, and into the foot. Sharp, shooting pain from the buttock down the back of the leg (sciatica) is frequently associated with a ruptured (herniated) disk. Pain may be caused by muscle spasm alone. Your caregiver can often find the cause of your pain by the details of your symptoms and an exam. In some cases, you may need tests (such as X-rays). Your caregiver will work with you to decide if any tests are needed based on your specific exam. HOME CARE INSTRUCTIONS   Avoid an underactive lifestyle. Active exercise, as directed by your caregiver, is your greatest weapon against back pain.  Avoid hard physical activities (tennis, racquetball, waterskiing) if you are not in proper physical condition for it. This may aggravate or create problems.  If you have a back problem, avoid sports requiring sudden body movements. Swimming and walking are generally safer activities.  Maintain good posture.  Avoid becoming overweight (obese).  Use bed rest for only the most extreme, sudden (acute) episode. Your caregiver will help you determine how much bed rest is necessary.  For acute conditions, you may put ice on the injured area.  Put ice in a plastic bag.  Place a towel between your skin and the bag.  Leave the ice on for 15-20 minutes at a time, every 2 hours, or as needed.  After you are improved and more active, it may help to  apply heat for 30 minutes before activities. See your caregiver if you are having pain that lasts longer than expected. Your caregiver can advise appropriate exercises or therapy if needed. With conditioning, most back problems can be avoided. SEEK IMMEDIATE MEDICAL CARE IF:   You have numbness, tingling, weakness, or problems with the use of your arms or legs.  You experience severe back pain not relieved with medicines.  There is a change in bowel or bladder control.  You have increasing pain in any area of the body, including your belly (abdomen).  You notice shortness of breath, dizziness, or feel faint.  You feel sick to your stomach (nauseous), are throwing up (vomiting), or become sweaty.  You notice discoloration of your toes or legs, or your feet get very cold.  Your back pain is getting worse.  You have a fever. MAKE SURE YOU:   Understand these instructions.  Will watch your condition.  Will get help right away if you are not doing well or get worse. Document Released: 09/17/2005 Document Revised: 03/01/2012 Document Reviewed: 03/09/2009 [facility] Patient Information 2014 [place], [place].  We will call you with physical therapy referral. May supplement Meloxicam with Tylenol.

## 2013-10-07 ENCOUNTER — Ambulatory Visit (INDEPENDENT_AMBULATORY_CARE_PROVIDER_SITE_OTHER): Payer: 59 | Admitting: Family Medicine

## 2013-10-07 ENCOUNTER — Encounter: Payer: Self-pay | Admitting: Family Medicine

## 2013-10-07 VITALS — BP 124/72 | HR 74 | Temp 97.8°F | Wt 267.0 lb

## 2013-10-07 DIAGNOSIS — M545 Low back pain: Secondary | ICD-10-CM

## 2013-10-07 NOTE — Progress Notes (Signed)
  Subjective:    Patient ID: Julie Holder, female    DOB: 08/15/1961, 52 y.o.   MRN: [id number]  HPI Followup regarding her low back pain. Refer to prior notes. Last visit, we had scheduled physical therapy and patient states she was never called. We have documentation that someone called on 10/10 at 11:30 AM and left message on machine-so referral was made.  Her back injury occurred at work and she was initially seen through occupational health but per patient was referred here. There has apparently been denial for coverage for her being seen here but she has not heard back from [facility] regarding followup through occupational medicine clinic.  Patient states her back pain is 80% improved. She still has pain after prolonged periods of sitting when first getting up or changing positions. No radiculopathy symptoms. No numbness or weakness. She is still taking meloxicam one daily. She is walking without difficulty. No urine or stool incontinence. No fevers. No dysuria. No appetite or weight changes.  Past Medical History  Diagnosis Date  . Chicken pox   . Hypertension   . Hyperlipidemia   . Hidradenitis suppurativa    Past Surgical History  Procedure Laterality Date  . Hand surgery  1975    reports that she quit smoking about 4 years ago. Her smoking use included Cigarettes. She has a 9 pack-year smoking history. She has never used smokeless tobacco. She reports that she does not drink alcohol or use illicit drugs. family history includes Cancer in her brother and father; Diabetes in her brother and mother; Heart disease in her mother; Hyperlipidemia in her brother, mother, and sister; Hypertension in her mother; Kidney disease in her mother. There is no history of Colon cancer. Allergies  Allergen Reactions  . Amoxicillin Shortness Of Breath  . Claravis [Isotretinoin]     Muscle spasms  . Fish Allergy     Shell fish  . Penicillins     hives  . Statins Other (See Comments)   Muscle spasms  . Aspirin Other (See Comments)      Review of Systems  Constitutional: Negative for fever, chills, appetite change and unexpected weight change.  Musculoskeletal: Positive for back pain.  Neurological: Negative for weakness and numbness.       Objective:   Physical Exam  Constitutional: She appears well-developed and well-nourished.  Cardiovascular: Normal rate and regular rhythm.   Pulmonary/Chest: Effort normal and breath sounds normal. No respiratory distress. She has no wheezes. She has no rales.  Musculoskeletal:  Straight leg raises are negative Patient is able to flex the back 90. She is able to extend back 20.  Neurological:  Full-strength lower extremities. 2+ reflexes ankle and knee.          Assessment & Plan:  Lumbar back pain. Suspect musculoskeletal. Nonfocal neurologic exam. She is by her estimate 80% improved. We had recommended physical therapy but patient never went. She is encouraged to followup with occupational health clinic.  She will check with them regarding getting physical therapy set up.

## 2013-10-24 ENCOUNTER — Encounter: Payer: Self-pay | Admitting: Family Medicine

## 2013-11-07 ENCOUNTER — Ambulatory Visit: Payer: PRIVATE HEALTH INSURANCE | Attending: Sports Medicine | Admitting: Physical Therapy

## 2013-11-07 DIAGNOSIS — M545 Low back pain, unspecified: Secondary | ICD-10-CM | POA: Insufficient documentation

## 2013-11-07 DIAGNOSIS — M25619 Stiffness of unspecified shoulder, not elsewhere classified: Secondary | ICD-10-CM | POA: Insufficient documentation

## 2013-11-07 DIAGNOSIS — IMO0001 Reserved for inherently not codable concepts without codable children: Secondary | ICD-10-CM | POA: Insufficient documentation

## 2013-11-07 DIAGNOSIS — M6281 Muscle weakness (generalized): Secondary | ICD-10-CM | POA: Insufficient documentation

## 2013-11-07 DIAGNOSIS — R293 Abnormal posture: Secondary | ICD-10-CM | POA: Insufficient documentation

## 2013-11-09 ENCOUNTER — Ambulatory Visit: Payer: PRIVATE HEALTH INSURANCE | Admitting: Physical Therapy

## 2013-11-15 ENCOUNTER — Ambulatory Visit: Payer: PRIVATE HEALTH INSURANCE | Admitting: Physical Therapy

## 2013-11-21 ENCOUNTER — Ambulatory Visit: Payer: PRIVATE HEALTH INSURANCE | Attending: Sports Medicine | Admitting: Physical Therapy

## 2013-11-21 DIAGNOSIS — M545 Low back pain, unspecified: Secondary | ICD-10-CM | POA: Insufficient documentation

## 2013-11-21 DIAGNOSIS — M25619 Stiffness of unspecified shoulder, not elsewhere classified: Secondary | ICD-10-CM | POA: Insufficient documentation

## 2013-11-21 DIAGNOSIS — M6281 Muscle weakness (generalized): Secondary | ICD-10-CM | POA: Insufficient documentation

## 2013-11-21 DIAGNOSIS — R293 Abnormal posture: Secondary | ICD-10-CM | POA: Insufficient documentation

## 2013-11-21 DIAGNOSIS — IMO0001 Reserved for inherently not codable concepts without codable children: Secondary | ICD-10-CM | POA: Insufficient documentation

## 2013-11-23 ENCOUNTER — Ambulatory Visit: Payer: PRIVATE HEALTH INSURANCE | Admitting: Physical Therapy

## 2013-11-28 ENCOUNTER — Ambulatory Visit: Payer: PRIVATE HEALTH INSURANCE | Admitting: Physical Therapy

## 2013-11-29 ENCOUNTER — Encounter: Payer: PRIVATE HEALTH INSURANCE | Admitting: Physical Therapy

## 2013-11-30 ENCOUNTER — Ambulatory Visit: Payer: PRIVATE HEALTH INSURANCE | Admitting: Physical Therapy

## 2013-12-01 ENCOUNTER — Encounter: Payer: PRIVATE HEALTH INSURANCE | Admitting: Physical Therapy

## 2013-12-05 ENCOUNTER — Ambulatory Visit: Payer: PRIVATE HEALTH INSURANCE | Admitting: Physical Therapy

## 2013-12-07 ENCOUNTER — Ambulatory Visit: Payer: PRIVATE HEALTH INSURANCE | Admitting: Rehabilitation

## 2013-12-12 ENCOUNTER — Ambulatory Visit: Payer: PRIVATE HEALTH INSURANCE | Admitting: Physical Therapy

## 2013-12-14 ENCOUNTER — Ambulatory Visit: Payer: PRIVATE HEALTH INSURANCE | Admitting: Physical Therapy

## 2013-12-19 ENCOUNTER — Ambulatory Visit: Payer: PRIVATE HEALTH INSURANCE | Admitting: Physical Therapy

## 2013-12-21 ENCOUNTER — Encounter: Payer: PRIVATE HEALTH INSURANCE | Admitting: Physical Therapy

## 2013-12-26 ENCOUNTER — Encounter: Payer: PRIVATE HEALTH INSURANCE | Admitting: Physical Therapy

## 2013-12-28 ENCOUNTER — Encounter: Payer: PRIVATE HEALTH INSURANCE | Admitting: Physical Therapy

## 2014-01-19 ENCOUNTER — Ambulatory Visit: Payer: PRIVATE HEALTH INSURANCE | Attending: Sports Medicine

## 2014-01-19 DIAGNOSIS — IMO0001 Reserved for inherently not codable concepts without codable children: Secondary | ICD-10-CM | POA: Insufficient documentation

## 2014-01-19 DIAGNOSIS — M25619 Stiffness of unspecified shoulder, not elsewhere classified: Secondary | ICD-10-CM | POA: Insufficient documentation

## 2014-01-19 DIAGNOSIS — R293 Abnormal posture: Secondary | ICD-10-CM | POA: Diagnosis not present

## 2014-01-19 DIAGNOSIS — M545 Low back pain, unspecified: Secondary | ICD-10-CM | POA: Diagnosis not present

## 2014-01-19 DIAGNOSIS — M6281 Muscle weakness (generalized): Secondary | ICD-10-CM | POA: Insufficient documentation

## 2014-01-23 ENCOUNTER — Ambulatory Visit: Payer: PRIVATE HEALTH INSURANCE | Attending: Sports Medicine | Admitting: Rehabilitation

## 2014-01-23 ENCOUNTER — Encounter: Payer: PRIVATE HEALTH INSURANCE | Admitting: Rehabilitation

## 2014-01-23 DIAGNOSIS — M25619 Stiffness of unspecified shoulder, not elsewhere classified: Secondary | ICD-10-CM | POA: Insufficient documentation

## 2014-01-23 DIAGNOSIS — R293 Abnormal posture: Secondary | ICD-10-CM | POA: Insufficient documentation

## 2014-01-23 DIAGNOSIS — M545 Low back pain, unspecified: Secondary | ICD-10-CM | POA: Insufficient documentation

## 2014-01-23 DIAGNOSIS — M6281 Muscle weakness (generalized): Secondary | ICD-10-CM | POA: Insufficient documentation

## 2014-01-23 DIAGNOSIS — IMO0001 Reserved for inherently not codable concepts without codable children: Secondary | ICD-10-CM | POA: Insufficient documentation

## 2014-01-26 ENCOUNTER — Ambulatory Visit: Payer: PRIVATE HEALTH INSURANCE

## 2014-02-01 ENCOUNTER — Ambulatory Visit: Payer: PRIVATE HEALTH INSURANCE

## 2014-02-09 ENCOUNTER — Ambulatory Visit: Payer: PRIVATE HEALTH INSURANCE

## 2014-02-13 ENCOUNTER — Other Ambulatory Visit: Payer: Self-pay | Admitting: Family Medicine

## 2014-02-13 ENCOUNTER — Ambulatory Visit: Payer: PRIVATE HEALTH INSURANCE | Admitting: Physical Therapy

## 2014-02-17 ENCOUNTER — Ambulatory Visit: Payer: PRIVATE HEALTH INSURANCE

## 2014-02-21 ENCOUNTER — Ambulatory Visit: Payer: PRIVATE HEALTH INSURANCE | Attending: Sports Medicine | Admitting: Physical Therapy

## 2014-02-21 DIAGNOSIS — M545 Low back pain, unspecified: Secondary | ICD-10-CM | POA: Insufficient documentation

## 2014-02-21 DIAGNOSIS — IMO0001 Reserved for inherently not codable concepts without codable children: Secondary | ICD-10-CM | POA: Insufficient documentation

## 2014-02-21 DIAGNOSIS — M25619 Stiffness of unspecified shoulder, not elsewhere classified: Secondary | ICD-10-CM | POA: Insufficient documentation

## 2014-02-21 DIAGNOSIS — R293 Abnormal posture: Secondary | ICD-10-CM | POA: Insufficient documentation

## 2014-02-21 DIAGNOSIS — M6281 Muscle weakness (generalized): Secondary | ICD-10-CM | POA: Insufficient documentation

## 2014-02-24 ENCOUNTER — Ambulatory Visit: Payer: PRIVATE HEALTH INSURANCE

## 2014-02-28 ENCOUNTER — Ambulatory Visit: Payer: PRIVATE HEALTH INSURANCE | Admitting: Physical Therapy

## 2014-03-02 ENCOUNTER — Ambulatory Visit: Payer: PRIVATE HEALTH INSURANCE | Admitting: Physical Therapy

## 2014-04-05 ENCOUNTER — Other Ambulatory Visit (HOSPITAL_COMMUNITY): Payer: Self-pay | Admitting: Sports Medicine

## 2014-04-05 DIAGNOSIS — M1990 Unspecified osteoarthritis, unspecified site: Secondary | ICD-10-CM

## 2014-04-17 ENCOUNTER — Ambulatory Visit (INDEPENDENT_AMBULATORY_CARE_PROVIDER_SITE_OTHER): Payer: 59 | Admitting: Family Medicine

## 2014-04-17 ENCOUNTER — Encounter: Payer: Self-pay | Admitting: Family Medicine

## 2014-04-17 VITALS — BP 130/70 | HR 78 | Temp 98.0°F | Ht 65.0 in | Wt 265.0 lb

## 2014-04-17 DIAGNOSIS — R5381 Other malaise: Secondary | ICD-10-CM

## 2014-04-17 DIAGNOSIS — R5383 Other fatigue: Secondary | ICD-10-CM

## 2014-04-17 DIAGNOSIS — Z0000 Encounter for general adult medical examination without abnormal findings: Secondary | ICD-10-CM

## 2014-04-17 LAB — BASIC METABOLIC PANEL
BUN: 25 mg/dL — AB (ref 6–23)
CHLORIDE: 100 meq/L (ref 96–112)
CO2: 27 meq/L (ref 19–32)
CREATININE: 1 mg/dL (ref 0.4–1.2)
Calcium: 9.5 mg/dL (ref 8.4–10.5)
GFR: 77.16 mL/min (ref >60.00)
Glucose, Bld: 82 mg/dL (ref 70–99)
POTASSIUM: 4.3 meq/L (ref 3.5–5.1)
Sodium: 136 mEq/L (ref 135–145)

## 2014-04-17 LAB — HEPATIC FUNCTION PANEL
ALT: 20 U/L (ref 0–35)
AST: 18 U/L (ref 0–37)
Albumin: 3.9 g/dL (ref 3.5–5.2)
Alkaline Phosphatase: 72 U/L (ref 39–117)
BILIRUBIN DIRECT: 0 mg/dL (ref 0.0–0.3)
BILIRUBIN TOTAL: 0.4 mg/dL (ref 0.3–1.2)
Total Protein: 8.6 g/dL — ABNORMAL HIGH (ref 6.0–8.3)

## 2014-04-17 LAB — CBC WITH DIFFERENTIAL/PLATELET
BASOS PCT: 0.7 % (ref 0.0–3.0)
Basophils Absolute: 0.1 10*3/uL (ref 0.0–0.1)
EOS ABS: 0.2 10*3/uL (ref 0.0–0.7)
Eosinophils Relative: 2.1 % (ref 0.0–5.0)
HCT: 32.4 % — ABNORMAL LOW (ref 36.0–46.0)
Hemoglobin: 10.7 g/dL — ABNORMAL LOW (ref 12.0–15.0)
LYMPHS PCT: 39 % (ref 12.0–46.0)
Lymphs Abs: 3.2 10*3/uL (ref 0.7–4.0)
MCHC: 33 g/dL (ref 30.0–36.0)
MCV: 85.1 fl (ref 78.0–100.0)
MONOS PCT: 5.8 % (ref 3.0–12.0)
Monocytes Absolute: 0.5 10*3/uL (ref 0.1–1.0)
Neutro Abs: 4.3 10*3/uL (ref 1.4–7.7)
Neutrophils Relative %: 52.4 % (ref 43.0–77.0)
Platelets: 439 10*3/uL — ABNORMAL HIGH (ref 150.0–400.0)
RBC: 3.81 Mil/uL — AB (ref 3.87–5.11)
RDW: 18.4 % — AB (ref 11.5–14.6)
WBC: 8.1 10*3/uL (ref 4.5–10.5)

## 2014-04-17 LAB — LIPID PANEL
CHOL/HDL RATIO: 7
Cholesterol: 225 mg/dL — ABNORMAL HIGH (ref 0–200)
HDL: 34.1 mg/dL — ABNORMAL LOW (ref >39.00)
LDL CALC: 178 mg/dL — AB (ref 0–99)
Triglycerides: 63 mg/dL (ref 0.0–149.0)
VLDL: 12.6 mg/dL (ref 0.0–40.0)

## 2014-04-17 LAB — TSH: TSH: 1.08 u[IU]/mL (ref 0.35–5.50)

## 2014-04-17 MED ORDER — PRAVASTATIN SODIUM 40 MG PO TABS: ORAL_TABLET | ORAL | Status: DC

## 2014-04-17 NOTE — Progress Notes (Signed)
   Subjective:    Patient ID: Julie Holder, female    DOB: [birth year], 53 y.o.   MRN: [id number]  HPI Patient seen for well visit. Her medical history significant for obesity, hypertension, hyperlipidemia, hidradenitis supperativa. Her immunizations are up-to-date. She had colonoscopy last year with benign polyps and recommended five-year followup. She's had consistently normal Pap smears. No history of HPV. She had recent mammogram last fall which was normal. Her weight has been stable. No consistent exercise but she just acquired a treadmill and plans to start walking on that soon.  She complains of some generalized fatigue issues. No reported chest pains. Compliant with medications. Nonsmoker. She quit about 5 years ago.  Past Medical History  Diagnosis Date  . Chicken pox   . Hypertension   . Hyperlipidemia   . Hidradenitis suppurativa    Past Surgical History  Procedure Laterality Date  . Hand surgery  1975    reports that she quit smoking about 5 years ago. Her smoking use included Cigarettes. She has a 9 pack-year smoking history. She has never used smokeless tobacco. She reports that she does not drink alcohol or use illicit drugs. family history includes Cancer in her brother and father; Diabetes in her brother and mother; Heart disease in her mother; Hyperlipidemia in her brother, mother, and sister; Hypertension in her mother; Kidney disease in her mother. There is no history of Colon cancer. Allergies  Allergen Reactions  . Amoxicillin Shortness Of Breath  . Claravis [Isotretinoin]     Muscle spasms  . Fish Allergy     Shell fish  . Penicillins     hives  . Statins Other (See Comments)    Muscle spasms  . Aspirin Other (See Comments)       Review of Systems  Constitutional: Positive for fatigue. Negative for fever, activity change, appetite change and unexpected weight change.  HENT: Negative for ear pain, hearing loss, sore throat and trouble swallowing.     Eyes: Negative for visual disturbance.  Respiratory: Negative for cough and shortness of breath.   Cardiovascular: Negative for chest pain and palpitations.  Gastrointestinal: Negative for abdominal pain, diarrhea, constipation and blood in stool.  Endocrine: Negative for polydipsia and polyuria.  Genitourinary: Negative for dysuria and hematuria.  Musculoskeletal: Negative for arthralgias, back pain and myalgias.  Skin: Negative for rash.  Neurological: Negative for dizziness, syncope and headaches.  Hematological: Negative for adenopathy.  Psychiatric/Behavioral: Negative for confusion and dysphoric mood.       Objective:   Physical Exam  Constitutional: She appears well-developed and well-nourished.  HENT:  Right Ear: External ear normal.  Left Ear: External ear normal.  Mouth/Throat: Oropharynx is clear and moist.  Neck: Neck supple. No thyromegaly present.  Cardiovascular: Normal rate and regular rhythm.  Exam reveals no gallop.   No murmur heard. Pulmonary/Chest: Effort normal and breath sounds normal. No respiratory distress. She has no wheezes. She has no rales.  Lymphadenopathy:    She has no cervical adenopathy.          Assessment & Plan:  Well visit. Obtain screening lab work. Continue current medications. We discussed the importance of weight loss. Screening for sleep apnea with Epworth Sleepiness Scale.  Her score is 11.  She is concerned about progressive daytime somnolence/fatigue.  Will set up evaluation. Her last pap was one year ago and normal.  Low risk and will go to every 2-3 years.  Continue with yearly mammogram.

## 2014-04-17 NOTE — Progress Notes (Signed)
Pre visit review using our clinic review tool, if applicable. No additional management support is needed unless otherwise documented below in the visit note. 

## 2014-04-20 ENCOUNTER — Ambulatory Visit (HOSPITAL_COMMUNITY)
Admission: RE | Admit: 2014-04-20 | Discharge: 2014-04-20 | Disposition: A | Discharge status: Home / Self Care | Payer: PRIVATE HEALTH INSURANCE | Source: Ambulatory Visit | Attending: Sports Medicine | Admitting: Sports Medicine

## 2014-04-20 DIAGNOSIS — M545 Low back pain, unspecified: Secondary | ICD-10-CM | POA: Insufficient documentation

## 2014-04-20 DIAGNOSIS — M1990 Unspecified osteoarthritis, unspecified site: Secondary | ICD-10-CM

## 2014-04-21 ENCOUNTER — Telehealth: Payer: Self-pay | Admitting: Family Medicine

## 2014-04-21 NOTE — Telephone Encounter (Signed)
Pt informed

## 2014-04-21 NOTE — Telephone Encounter (Signed)
No heavy lifting (over 15 pounds).  Make sure she follows up with vascular surgeon.

## 2014-04-21 NOTE — Telephone Encounter (Signed)
Caller: Julie Holder/Patient; Phone: [phone]; Reason for Call: Seen in office Monday 4/27.  Had MRI of back yesterday 4/30.  Saw Dr Delilah Shan, orthopedic at 8 am today 5/1 and was shown she had aneurysm 6" on lower back.  Dr Delilah Shan sent referral to vascular surgeon on [address].   Asking what she can and can't do.  Has not eaten today 5/1 - does not know want might aggrivate situation.  What to do next.  Was told if abdominal pain to go to ER immediately.  Please review and call patient at [phone] or cell [phone].

## 2014-04-24 ENCOUNTER — Other Ambulatory Visit: Payer: Self-pay | Admitting: *Deleted

## 2014-04-24 DIAGNOSIS — I714 Abdominal aortic aneurysm, without rupture, unspecified: Secondary | ICD-10-CM

## 2014-04-24 NOTE — Telephone Encounter (Signed)
Pt had not heard anything from drs. Was able to find phone number for [address]

## 2014-04-25 ENCOUNTER — Encounter: Payer: Self-pay | Admitting: Vascular Surgery

## 2014-04-25 ENCOUNTER — Ambulatory Visit (INDEPENDENT_AMBULATORY_CARE_PROVIDER_SITE_OTHER): Payer: 59 | Admitting: Vascular Surgery

## 2014-04-25 ENCOUNTER — Ambulatory Visit (INDEPENDENT_AMBULATORY_CARE_PROVIDER_SITE_OTHER)
Admission: RE | Admit: 2014-04-25 | Discharge: 2014-04-25 | Disposition: A | Discharge status: Home / Self Care | Payer: 59 | Source: Ambulatory Visit | Attending: Vascular Surgery | Admitting: Vascular Surgery

## 2014-04-25 ENCOUNTER — Ambulatory Visit (HOSPITAL_COMMUNITY)
Admission: RE | Admit: 2014-04-25 | Discharge: 2014-04-25 | Disposition: A | Discharge status: Home / Self Care | Payer: 59 | Source: Ambulatory Visit | Attending: Vascular Surgery | Admitting: Vascular Surgery

## 2014-04-25 VITALS — BP 126/83 | HR 86 | Ht 65.0 in | Wt 262.5 lb

## 2014-04-25 DIAGNOSIS — I714 Abdominal aortic aneurysm, without rupture, unspecified: Secondary | ICD-10-CM | POA: Insufficient documentation

## 2014-04-25 DIAGNOSIS — Z48812 Encounter for surgical aftercare following surgery on the circulatory system: Secondary | ICD-10-CM | POA: Insufficient documentation

## 2014-04-25 DIAGNOSIS — Z01810 Encounter for preprocedural cardiovascular examination: Secondary | ICD-10-CM

## 2014-04-25 NOTE — Progress Notes (Signed)
Subjective:     Patient ID: Julie Holder, female   DOB: 07-14-1961, 53 y.o.   MRN: [id number]  HPI this 53 year old female is referred for evaluation of abdominal aortic aneurysm-6.1 cm in maximum diameter by recent MRI scan. 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 at [facility]. She has been evaluated and recently had an MRI scan which revealed the aortic aneurysm which was previously unknown. She has no family history of aortic aneurysm. She does have chronic back discomfort. She denies any other symptoms.  Past Medical History  Diagnosis Date  . Chicken pox   . Hypertension   . Hyperlipidemia   . Hidradenitis suppurativa   . Anemia     History  Substance Use Topics  . Smoking status: Former Smoker -- 0.30 packs/day for 30 years    Types: Cigarettes    Quit date: 01/21/2009  . Smokeless tobacco: Never Used  . Alcohol Use: No    Family History  Problem Relation Age of Onset  . Heart disease Mother     CHF  . Hyperlipidemia Mother   . Kidney disease Mother   . Diabetes Mother   . Hypertension Mother   . Hyperlipidemia Sister   . Hypertension Sister   . Hyperlipidemia Brother   . Diabetes Brother   . Hypertension Brother   . Colon cancer Neg Hx   . Cancer Brother   . Cancer Father   . Hypertension Father   . Hyperlipidemia Father   . Hyperlipidemia Son     Allergies  Allergen Reactions  . Amoxicillin Shortness Of Breath  . Claravis [Isotretinoin]     Muscle spasms  . Fish Allergy     Shell fish  . Penicillins     hives  . Statins Other (See Comments)    Muscle spasms  . Aspirin Other (See Comments)    Current outpatient prescriptions:cholecalciferol (VITAMIN D) 400 UNITS TABS, Take 400 Units by mouth daily., Disp: , Rfl: ;  Coenzyme Q10 (COQ10) 100 MG CAPS, Take 100 mg by mouth 2 (two) times daily., Disp: , Rfl: ;  docusate sodium (COLACE) 100 MG capsule, Take 100 mg by mouth 2 (two) times daily., Disp: , Rfl: ;   lisinopril-hydrochlorothiazide (PRINZIDE,ZESTORETIC) 10-12.5 MG per tablet, TAKE 1 TABLET BY MOUTH DAILY, Disp: 90 tablet, Rfl: 2 mupirocin ointment (BACTROBAN) 2 %, Apply 1 application topically 2 (two) times daily. Use daily to affected areas as needed per dermatologist, Disp: , Rfl: ;  Omega-3 Fatty Acids (FISH OIL) 1200 MG CAPS, Take 1,200 mg by mouth 2 (two) times daily., Disp: , Rfl: ;  pravastatin (PRAVACHOL) 40 MG tablet, Take one half tablet twice daily, Disp: 90 tablet, Rfl: 3;  Red Yeast Rice 600 MG CAPS, Take 600 mg by mouth 2 (two) times daily., Disp: , Rfl:  traMADol (ULTRAM) 50 MG tablet, Take 50 mg by mouth every 6 (six) hours as needed for pain. Per dermatologist, Disp: , Rfl:   BP 126/83  Pulse 86  Ht 5\' 5"  (1.651 m)  Wt 262 lb 8 oz (119.069 kg)  BMI 43.68 kg/m2  SpO2 100%  Body mass index is 43.68 kg/(m^2).           Review of Systems patient complains of back discomfort, bilateral knee pain particularly on the left, sweat gland problems with hidradenitis, denies chest pain, dyspnea on exertion, PND, orthopnea, hemoptysis. Other systems negative complete review of systems     Objective:  Physical Exam BP 126/83  Pulse 86  Ht 5\' 5"  (1.651 m)  Wt 262 lb 8 oz (119.069 kg)  BMI 43.68 kg/m2  SpO2 100%  Gen.-alert and oriented x3 in no apparent distress HEENT normal for age Lungs no rhonchi or wheezing Cardiovascular regular rhythm no murmurs carotid pulses 3+ palpable no bruits audible Abdomen soft nontender no palpable masses-obese Musculoskeletal free of  major deformities Skin clear -no rashes Neurologic normal Lower extremities 3+ femoral and dorsalis pedis pulses palpable bilaterally with no edema  Today I ordered a duplex scan of the abdominal aorta which are reviewed and interpreted. Measurements revealed the aortic aneurysm to be 5.87 x 5.70 maximum diameter. I also reviewed the MRI scan by computer and agree that there is a infrarenal aortic  aneurysm which measures approximately 6 cm in maximum diameter. The most proximal extent of this near the renal arteries is not visualized        Assessment:     #1 abdominal aortic aneurysms-6 cm maximum diameter #2 obesity #3 hidradenitis    Plan:     #1 Will order Cardiolite exam to be done this week #2 order CT angiogram of abdomen and pelvis to see if patient candidate for aortic stent grafting #3 plan ABIs either today or on return #4 patient to return next week to discuss possible treatment options for her large abdominal aortic aneurysm

## 2014-04-25 NOTE — Addendum Note (Signed)
Addended by: Mena Goes on: 04/25/2014 03:45 PM   Modules accepted: Orders

## 2014-04-26 ENCOUNTER — Ambulatory Visit (HOSPITAL_COMMUNITY): Payer: 59 | Attending: Cardiology | Admitting: Radiology

## 2014-04-26 VITALS — BP 103/60 | Ht 65.0 in | Wt 264.0 lb

## 2014-04-26 DIAGNOSIS — I714 Abdominal aortic aneurysm, without rupture, unspecified: Secondary | ICD-10-CM | POA: Insufficient documentation

## 2014-04-26 DIAGNOSIS — Z0279 Encounter for issue of other medical certificate: Secondary | ICD-10-CM

## 2014-04-26 DIAGNOSIS — Z01810 Encounter for preprocedural cardiovascular examination: Secondary | ICD-10-CM | POA: Insufficient documentation

## 2014-04-26 MED ORDER — REGADENOSON 0.4 MG/5ML IV SOLN
0.4000 mg | Freq: Once | INTRAVENOUS | Status: AC
  Administered 2014-04-26: 0.4 mg via INTRAVENOUS

## 2014-04-26 MED ORDER — TECHNETIUM TC 99M SESTAMIBI GENERIC - CARDIOLITE
30.0000 | Freq: Once | INTRAVENOUS | Status: AC | PRN
  Administered 2014-04-26: 30 via INTRAVENOUS

## 2014-04-26 NOTE — Progress Notes (Signed)
[facility] [address] [place], [place] [address] [phone]    Cardiology Nuclear Med Study  Julie Holder is a 53 y.o. female     MRN : [id number]     DOB: 12-25-1960  Procedure Date: 04/26/2014  Nuclear Med Background Indication for Stress Test:  Evaluation for Ischemia History:  AAA 6.1 Cardiac Risk Factors: History of Smoking, Hypertension, Lipids and PVD  Symptoms:  DOE   Nuclear Pre-Procedure Caffeine/Decaff Intake:  None NPO After: 7:00pm   Lungs:  clear O2 Sat: 99% on room air. IV 0.9% NS with Angio Cath:  22g  IV Site: R Antecubital  IV Started by:  Perrin Maltese, EMT-P  Chest Size (in):  36 Cup Size: DD  Height: 5\' 5"  (1.651 m)  Weight:  264 lb (119.75 kg)  BMI:  Body mass index is 43.93 kg/(m^2). Tech Comments:  Rx this am    Nuclear Med Study 1 or 2 day study: 2 day  Stress Test Type:  Carlton Adam  Reading MD: n/a  Order Authorizing Provider:  J.Lawson MD  Resting Radionuclide: Technetium 66m Sestamibi  Resting Radionuclide Dose: 33.0 mCi on 04/27/14   Stress Radionuclide:  Technetium 72m Sestamibi  Stress Radionuclide Dose: 33.0 mCi on 04/26/14           Stress Protocol Rest HR: 65 Stress HR: 102  Rest BP: 103/60 Stress BP: 120/67  Exercise Time (min): n/a METS: n/a   Predicted Max HR: 167 bpm % Max HR: 61.08 bpm Rate Pressure Product: 12342   Dose of Adenosine (mg):  n/a Dose of Lexiscan: 0.4 mg  Dose of Atropine (mg): n/a Dose of Dobutamine: n/a mcg/kg/min (at max HR)  Stress Test Technologist: Perrin Maltese, EMT-P  Nuclear Technologist:  Charlton Amor, CNMT     Rest Procedure:  Myocardial perfusion imaging was performed at rest 45 minutes following the intravenous administration of Technetium 40m Sestamibi. Rest ECG: NSR - Normal EKG  Stress Procedure:  The patient received IV Lexiscan 0.4 mg over 15-seconds.  Technetium 31m Sestamibi injected at 30-seconds.  This patient had abdominal pain, headache, and  felt funny in her chest with the Lexiscan injection. Quantitative spect images were obtained after a 45 minute delay. Stress ECG: No significant change from baseline ECG  QPS Raw Data Images:  There is a breast shadow that accounts for the anterior attenuation. Stress Images:  There is decreased uptake in the anterior wall. Rest Images:  There is decreased uptake in the anterior wall. Subtraction (SDS):  There is a fixed anteriour defect that is most consistent with breast attenuation. Transient Ischemic Dilatation (Normal <1.22):  1.13 Lung/Heart Ratio (Normal <0.45):  0.24  Quantitative Gated Spect Images QGS EDV:  135 ml QGS ESV:  65 ml  Impression Exercise Capacity:  Lexiscan with no exercise. BP Response:  Normal blood pressure response. Clinical Symptoms:  No significant symptoms noted. ECG Impression:  No significant ECG changes with Lexiscan. Comparison with Prior Nuclear Study: No previous nuclear study performed  Overall Impression:  Low risk stress nuclear study with breast attenuation artifact.  LV Ejection Fraction: 52%.  LV Wall Motion:  Low normal LF systolic function, no regional wall motion abnormalities. Cannot fully exclude "balanced ischemia" in a patient with established PAD  Sanda Klein, MD, [facility] [phone] office [phone] pager

## 2014-04-27 ENCOUNTER — Ambulatory Visit (HOSPITAL_BASED_OUTPATIENT_CLINIC_OR_DEPARTMENT_OTHER): Payer: 59

## 2014-04-27 DIAGNOSIS — R0989 Other specified symptoms and signs involving the circulatory and respiratory systems: Secondary | ICD-10-CM

## 2014-04-27 MED ORDER — TECHNETIUM TC 99M SESTAMIBI GENERIC - CARDIOLITE
33.0000 | Freq: Once | INTRAVENOUS | Status: AC | PRN
  Administered 2014-04-27: 33 via INTRAVENOUS

## 2014-04-28 ENCOUNTER — Ambulatory Visit (HOSPITAL_COMMUNITY)
Admission: RE | Admit: 2014-04-28 | Discharge: 2014-04-28 | Disposition: A | Discharge status: Home / Self Care | Payer: 59 | Source: Ambulatory Visit | Attending: Vascular Surgery | Admitting: Vascular Surgery

## 2014-04-28 ENCOUNTER — Encounter: Payer: Self-pay | Admitting: Vascular Surgery

## 2014-04-28 DIAGNOSIS — I714 Abdominal aortic aneurysm, without rupture, unspecified: Secondary | ICD-10-CM | POA: Insufficient documentation

## 2014-04-28 DIAGNOSIS — Z01810 Encounter for preprocedural cardiovascular examination: Secondary | ICD-10-CM

## 2014-04-28 MED ORDER — IOHEXOL 350 MG/ML SOLN
100.0000 mL | Freq: Once | INTRAVENOUS | Status: AC | PRN
  Administered 2014-04-28: 100 mL via INTRAVENOUS

## 2014-05-01 ENCOUNTER — Ambulatory Visit (INDEPENDENT_AMBULATORY_CARE_PROVIDER_SITE_OTHER): Payer: 59 | Admitting: Vascular Surgery

## 2014-05-01 ENCOUNTER — Ambulatory Visit: Payer: Self-pay | Admitting: Vascular Surgery

## 2014-05-01 ENCOUNTER — Encounter: Payer: Self-pay | Admitting: Vascular Surgery

## 2014-05-01 ENCOUNTER — Other Ambulatory Visit: Payer: Self-pay

## 2014-05-01 VITALS — BP 129/78 | HR 76 | Resp 16 | Ht 65.0 in | Wt 258.0 lb

## 2014-05-01 DIAGNOSIS — I714 Abdominal aortic aneurysm, without rupture, unspecified: Secondary | ICD-10-CM

## 2014-05-01 NOTE — Progress Notes (Signed)
Subjective:     Patient ID: Julie Holder, female   DOB: August 28, 1961, 53 y.o.   MRN: [id number]  HPI this 53 year old female returns today for further discussion about her large abdominal aortic aneurysm. She had a CT angiogram performed on May 8 which I have reviewed the computer. Aneurysm measure 6.3 cm in maximum diameter and extends up to the renal arteries with a very short-less than 5 mm-neck below the renal arteries. 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 Patient denies any new symptoms of abdominal or back pain. She has no history of coronary artery disease, CVA, TIAs, diabetes mellitus. She stopped smoking 5 years ago and denies COPD   Review of Systems     Objective:   Physical Exam BP 129/78  Pulse 76  Resp 16  Ht 5\' 5"  (1.651 m)  Wt 258 lb (117.028 kg)  BMI 42.93 kg/m2  LMP 05/31/2011  Gen. 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 This would require either percutaneous or possibly open femoral artery exposure and through areas of chronic suppurative hidradenitis Do not feel this is a good option    Plan:     Plan open resection and grafting of abdominal aortic aneurysm and insertion aortobiiliac graft on  Wednesday, May 13-patient would like to proceed as soon as possible Risks and benefits including 2-3% operative mortality discussed with patient and she would like to proceed

## 2014-05-02 ENCOUNTER — Encounter (HOSPITAL_COMMUNITY): Payer: Self-pay | Admitting: Pharmacy Technician

## 2014-05-02 ENCOUNTER — Encounter (HOSPITAL_COMMUNITY): Payer: Self-pay

## 2014-05-02 ENCOUNTER — Encounter (HOSPITAL_COMMUNITY)
Admission: RE | Admit: 2014-05-02 | Discharge: 2014-05-02 | Disposition: A | Discharge status: Home / Self Care | Payer: 59 | Source: Ambulatory Visit | Attending: Vascular Surgery | Admitting: Vascular Surgery

## 2014-05-02 ENCOUNTER — Ambulatory Visit (HOSPITAL_COMMUNITY)
Admission: RE | Admit: 2014-05-02 | Discharge: 2014-05-02 | Disposition: A | Discharge status: Home / Self Care | Payer: 59 | Source: Ambulatory Visit | Attending: Anesthesiology | Admitting: Anesthesiology

## 2014-05-02 LAB — SURGICAL PCR SCREEN
MRSA, PCR: NEGATIVE
STAPHYLOCOCCUS AUREUS: NEGATIVE

## 2014-05-02 LAB — URINALYSIS, ROUTINE W REFLEX MICROSCOPIC
Bilirubin Urine: NEGATIVE
GLUCOSE, UA: NEGATIVE mg/dL
HGB URINE DIPSTICK: NEGATIVE
KETONES UR: NEGATIVE mg/dL
Leukocytes, UA: NEGATIVE
Nitrite: NEGATIVE
PROTEIN: NEGATIVE mg/dL
Specific Gravity, Urine: 1.016 (ref 1.005–1.030)
UROBILINOGEN UA: 0.2 mg/dL (ref 0.0–1.0)
pH: 6 (ref 5.0–8.0)

## 2014-05-02 LAB — COMPREHENSIVE METABOLIC PANEL
ALK PHOS: 86 U/L (ref 39–117)
ALT: 18 U/L (ref 0–35)
AST: 20 U/L (ref 0–37)
Albumin: 3.7 g/dL (ref 3.5–5.2)
BUN: 18 mg/dL (ref 6–23)
CO2: 25 mEq/L (ref 19–32)
Calcium: 9.5 mg/dL (ref 8.4–10.5)
Chloride: 98 mEq/L (ref 96–112)
Creatinine, Ser: 0.97 mg/dL (ref 0.50–1.10)
GFR calc Af Amer: 76 mL/min — ABNORMAL LOW (ref >90)
GFR calc non Af Amer: 66 mL/min — ABNORMAL LOW (ref >90)
Glucose, Bld: 87 mg/dL (ref 70–99)
POTASSIUM: 4.4 meq/L (ref 3.7–5.3)
SODIUM: 137 meq/L (ref 137–147)
TOTAL PROTEIN: 8.7 g/dL — AB (ref 6.0–8.3)
Total Bilirubin: <0.2 mg/dL — ABNORMAL LOW (ref 0.3–1.2)

## 2014-05-02 LAB — CBC
HCT: 31.3 % — ABNORMAL LOW (ref 36.0–46.0)
Hemoglobin: 10.2 g/dL — ABNORMAL LOW (ref 12.0–15.0)
MCH: 27.6 pg (ref 26.0–34.0)
MCHC: 32.6 g/dL (ref 30.0–36.0)
MCV: 84.8 fL (ref 78.0–100.0)
PLATELETS: 378 10*3/uL (ref 150–400)
RBC: 3.69 MIL/uL — ABNORMAL LOW (ref 3.87–5.11)
RDW: 16.8 % — ABNORMAL HIGH (ref 11.5–15.5)
WBC: 7.6 10*3/uL (ref 4.0–10.5)

## 2014-05-02 LAB — APTT: APTT: 35 s (ref 24–37)

## 2014-05-02 LAB — PROTIME-INR
INR: 1.06 (ref 0.00–1.49)
Prothrombin Time: 13.6 seconds (ref 11.6–15.2)

## 2014-05-02 LAB — PREPARE RBC (CROSSMATCH)

## 2014-05-02 LAB — ABO/RH: ABO/RH(D): B POS

## 2014-05-02 MED ORDER — CHLORHEXIDINE GLUCONATE CLOTH 2 % EX PADS: 6.0000 | MEDICATED_PAD | Freq: Once | CUTANEOUS | Status: DC

## 2014-05-02 MED ORDER — VANCOMYCIN HCL 10 G IV SOLR
1500.0000 mg | INTRAVENOUS | Status: AC
  Administered 2014-05-03: 1500 mg via INTRAVENOUS
  Filled 2014-05-02 (×2): qty 1500

## 2014-05-02 MED ORDER — SODIUM CHLORIDE 0.9 % IV SOLN: INTRAVENOUS | Status: DC

## 2014-05-02 NOTE — Progress Notes (Signed)
05/02/14 1017  OBSTRUCTIVE SLEEP APNEA  Have you ever been diagnosed with sleep apnea through a sleep study? No  Do you snore loudly (loud enough to be heard through closed doors)?  1  Do you often feel tired, fatigued, or sleepy during the daytime? 1  Has anyone observed you stop breathing during your sleep? 1  Do you have, or are you being treated for high blood pressure? 1  BMI more than 35 kg/m2? 1  Age over 54 years old? 1  Neck circumference greater than 40 cm/16 inches? 1  Gender: 0  Obstructive Sleep Apnea Score 7

## 2014-05-02 NOTE — Progress Notes (Signed)
Anesthesia Chart Review:  Patient is a 53 year old female scheduled for open AAA repair on 05/03/14 by Dr. Kellie Simmering.  History includes 6.3 cm AAA, former smoker, HTN, HLD, anemia, hidradenitis suppurativa. BMI is 44 consistent with morbid obesity.  She had normal ABIs on 04/25/14. PCP is Dr. Carolann Littler.  Dr. Kellie Simmering ordered a preoperative nuclear stress test (without formal cardiology evaluation) which was done on 04/26/14 and showed:  Impression  Exercise Capacity: Lexiscan with no exercise.  BP Response: Normal blood pressure response.  Clinical Symptoms: No significant symptoms noted.  ECG Impression: No significant ECG changes with Lexiscan.  Comparison with Prior Nuclear Study: No previous nuclear study performed  Overall Impression: Low risk stress nuclear study with breast attenuation artifact. LV Ejection Fraction: 52%. LV Wall Motion: Low normal LF systolic function, no regional wall motion abnormalities. Cannot fully exclude "balanced ischemia" in a patient with established PAD (Dr. Dani Gobble Croitoru).  EKG on 05/02/14 showed NSR.  CXR report on 05/02/14 showed no active cardiopulmonary disease.  Preoperative labs noted.  H/H 10.2/31.3.  ABG and T&S are still in process.    Pending results to be followed up by her anesthesiologist and Dr. Kellie Simmering.  I did review stress test results with anesthesiologist Dr. Ola Spurr.  If no acute changes then it is anticipated that she could proceed as planned.  [facility] Short Stay Center/Anesthesiology Phone [phone] 05/02/2014 2:13 PM

## 2014-05-02 NOTE — Progress Notes (Signed)
Pt denies SOB, chest pain, and being under the care of a cardiologist. Pt denies having a chest x ray and EKG in the last year and having an echo or cardiac cath.

## 2014-05-02 NOTE — Pre-Procedure Instructions (Signed)
Ilse Billman  05/02/2014   Your procedure is scheduled on: Wednesday, May 03, 2014 at 8:30 AM  Report to [place] (use Main Entrance "A') at 6:30 AM.  Call this number if you have problems the morning of surgery: [phone]   Remember:   Do not eat food or drink liquids after midnight.   Take these medicines the morning of surgery with A SIP OF WATER: if needed:traMADol (ULTRAM) For pain Stop taking vitamins and herbal medications (  Fish Oil, Coenzyme Q10, Red Yeast Rice) Do not take any NSAIDs ie: Ibuprofen, Advil, Naproxen ( Aleve) etc.  Do not wear jewelry, make-up or nail polish.  Do not wear lotions, powders, or perfumes. You may wear deodorant.  Do not shave 48 hours prior to surgery.   Do not bring valuables to the hospital.  [facility] is not responsible for any belongings or valuables.               Contacts, dentures or bridgework may not be worn into surgery.  Leave suitcase in the car. After surgery it may be brought to your room.  For patients admitted to the hospital, discharge time is determined by your treatment team.               Patients discharged the day of surgery will not be allowed to drive home.  Name and phone number of your driver:  Special Instructions:  Special Instructions:Special Instructions: [facility] - Preparing for Surgery  Before surgery, you can play an important role.  Because skin is not sterile, your skin needs to be as free of germs as possible.  You can reduce the number of germs on you skin by washing with CHG (chlorahexidine gluconate) soap before surgery.  CHG is an antiseptic cleaner which kills germs and bonds with the skin to continue killing germs even after washing.  Please DO NOT use if you have an allergy to CHG or antibacterial soaps.  If your skin becomes reddened/irritated stop using the CHG and inform your nurse when you arrive at Short Stay.  Do not shave (including legs and underarms) for at  least 48 hours prior to the first CHG shower.  You may shave your face.  Please follow these instructions carefully:   1.  Shower with CHG Soap the night before surgery and the morning of Surgery.  2.  If you choose to wash your hair, wash your hair first as usual with your normal shampoo.  3.  After you shampoo, rinse your hair and body thoroughly to remove the Shampoo.  4.  Use CHG as you would any other liquid soap.  You can apply chg directly  to the skin and wash gently with scrungie or a clean washcloth.  5.  Apply the CHG Soap to your body ONLY FROM THE NECK DOWN.  Do not use on open wounds or open sores.  Avoid contact with your eyes, ears, mouth and genitals (private parts).  Wash genitals (private parts) with your normal soap.  6.  Wash thoroughly, paying special attention to the area where your surgery will be performed.  7.  Thoroughly rinse your body with warm water from the neck down.  8.  DO NOT shower/wash with your normal soap after using and rinsing off the CHG Soap.  9.  Pat yourself dry with a clean towel.            10.  Wear clean pajamas.  11.  Place clean sheets on your bed the night of your first shower and do not sleep with pets.  Day of Surgery  Do not apply any lotionss the morning of surgery.  Please wear clean clothes to the hospital/surgery center.   Please read over the following fact sheets that you were given: Pain Booklet, Coughing and Deep Breathing, Blood Transfusion Information, MRSA Information and Surgical Site Infection Prevention

## 2014-05-03 ENCOUNTER — Inpatient Hospital Stay (HOSPITAL_COMMUNITY): Payer: 59

## 2014-05-03 ENCOUNTER — Encounter (HOSPITAL_COMMUNITY)
Admission: RE | Disposition: A | Discharge status: Home / Self Care | Payer: Self-pay | Source: Ambulatory Visit | Attending: Vascular Surgery

## 2014-05-03 ENCOUNTER — Inpatient Hospital Stay (HOSPITAL_COMMUNITY): Payer: 59 | Admitting: Certified Registered Nurse Anesthetist

## 2014-05-03 ENCOUNTER — Institutional Professional Consult (permissible substitution): Payer: PRIVATE HEALTH INSURANCE | Admitting: Pulmonary Disease

## 2014-05-03 ENCOUNTER — Inpatient Hospital Stay (HOSPITAL_COMMUNITY)
Admission: RE | Admit: 2014-05-03 | Discharge: 2014-05-09 | DRG: 238 | Disposition: A | Discharge status: Home / Self Care | Payer: 59 | Source: Ambulatory Visit | Attending: Vascular Surgery | Admitting: Vascular Surgery

## 2014-05-03 ENCOUNTER — Encounter (HOSPITAL_COMMUNITY): Payer: 59 | Admitting: Certified Registered Nurse Anesthetist

## 2014-05-03 ENCOUNTER — Encounter (HOSPITAL_COMMUNITY): Payer: Self-pay | Admitting: *Deleted

## 2014-05-03 DIAGNOSIS — E785 Hyperlipidemia, unspecified: Secondary | ICD-10-CM | POA: Diagnosis present

## 2014-05-03 DIAGNOSIS — I714 Abdominal aortic aneurysm, without rupture, unspecified: Principal | ICD-10-CM | POA: Diagnosis present

## 2014-05-03 DIAGNOSIS — E669 Obesity, unspecified: Secondary | ICD-10-CM | POA: Diagnosis present

## 2014-05-03 DIAGNOSIS — D62 Acute posthemorrhagic anemia: Secondary | ICD-10-CM | POA: Diagnosis not present

## 2014-05-03 DIAGNOSIS — I10 Essential (primary) hypertension: Secondary | ICD-10-CM | POA: Diagnosis present

## 2014-05-03 DIAGNOSIS — Z0000 Encounter for general adult medical examination without abnormal findings: Secondary | ICD-10-CM

## 2014-05-03 DIAGNOSIS — Z6841 Body Mass Index (BMI) 40.0 and over, adult: Secondary | ICD-10-CM

## 2014-05-03 DIAGNOSIS — Z79899 Other long term (current) drug therapy: Secondary | ICD-10-CM

## 2014-05-03 DIAGNOSIS — Z87891 Personal history of nicotine dependence: Secondary | ICD-10-CM

## 2014-05-03 HISTORY — PX: ABDOMINAL AORTIC ANEURYSM REPAIR: SHX42

## 2014-05-03 HISTORY — PX: ABDOMINAL AORTIC ANEURYSM REPAIR: SUR1152

## 2014-05-03 LAB — CBC
HEMATOCRIT: 31.2 % — AB (ref 36.0–46.0)
HEMOGLOBIN: 10.6 g/dL — AB (ref 12.0–15.0)
MCH: 28.2 pg (ref 26.0–34.0)
MCHC: 34 g/dL (ref 30.0–36.0)
MCV: 83 fL (ref 78.0–100.0)
Platelets: 275 10*3/uL (ref 150–400)
RBC: 3.76 MIL/uL — ABNORMAL LOW (ref 3.87–5.11)
RDW: 16 % — ABNORMAL HIGH (ref 11.5–15.5)
WBC: 16.4 10*3/uL — AB (ref 4.0–10.5)

## 2014-05-03 LAB — POCT I-STAT 7, (LYTES, BLD GAS, ICA,H+H)
ACID-BASE DEFICIT: 2 mmol/L (ref 0.0–2.0)
Acid-base deficit: 2 mmol/L (ref 0.0–2.0)
Bicarbonate: 24.7 mEq/L — ABNORMAL HIGH (ref 20.0–24.0)
Bicarbonate: 23.7 mEq/L (ref 20.0–24.0)
CALCIUM ION: 1.17 mmol/L (ref 1.12–1.23)
CALCIUM ION: 1.13 mmol/L (ref 1.12–1.23)
HCT: 22 % — ABNORMAL LOW (ref 36.0–46.0)
HEMATOCRIT: 26 % — AB (ref 36.0–46.0)
Hemoglobin: 8.8 g/dL — ABNORMAL LOW (ref 12.0–15.0)
Hemoglobin: 7.5 g/dL — ABNORMAL LOW (ref 12.0–15.0)
O2 SAT: 100 %
O2 Saturation: 98 %
PCO2 ART: 45.6 mmHg — AB (ref 35.0–45.0)
PH ART: 7.335 — AB (ref 7.350–7.450)
Patient temperature: 35.7
Potassium: 4.4 mEq/L (ref 3.7–5.3)
Potassium: 4.4 mEq/L (ref 3.7–5.3)
Sodium: 135 mEq/L — ABNORMAL LOW (ref 137–147)
Sodium: 135 mEq/L — ABNORMAL LOW (ref 137–147)
TCO2: 26 mmol/L (ref 0–100)
TCO2: 25 mmol/L (ref 0–100)
pCO2 arterial: 39.5 mmHg (ref 35.0–45.0)
pH, Arterial: 7.381 (ref 7.350–7.450)
pO2, Arterial: 103 mmHg — ABNORMAL HIGH (ref 80.0–100.0)
pO2, Arterial: 179 mmHg — ABNORMAL HIGH (ref 80.0–100.0)

## 2014-05-03 LAB — BASIC METABOLIC PANEL
BUN: 15 mg/dL (ref 6–23)
CHLORIDE: 103 meq/L (ref 96–112)
CO2: 24 mEq/L (ref 19–32)
Calcium: 8.5 mg/dL (ref 8.4–10.5)
Creatinine, Ser: 0.86 mg/dL (ref 0.50–1.10)
GFR calc non Af Amer: 76 mL/min — ABNORMAL LOW (ref >90)
GFR, EST AFRICAN AMERICAN: 88 mL/min — AB (ref >90)
Glucose, Bld: 155 mg/dL — ABNORMAL HIGH (ref 70–99)
POTASSIUM: 4.4 meq/L (ref 3.7–5.3)
Sodium: 138 mEq/L (ref 137–147)

## 2014-05-03 LAB — BLOOD GAS, ARTERIAL
Acid-base deficit: 0.8 mmol/L (ref 0.0–2.0)
Acid-base deficit: 1.2 mmol/L (ref 0.0–2.0)
BICARBONATE: 23.3 meq/L (ref 20.0–24.0)
BICARBONATE: 24 meq/L (ref 20.0–24.0)
DRAWN BY: 206361
O2 Content: 4 L/min
O2 SAT: 95.9 %
O2 SAT: 96.4 %
PCO2 ART: 38.1 mmHg (ref 35.0–45.0)
PH ART: 7.326 — AB (ref 7.350–7.450)
PO2 ART: 84.5 mmHg (ref 80.0–100.0)
PO2 ART: 95.3 mmHg (ref 80.0–100.0)
Patient temperature: 98.6
Patient temperature: 98.5
TCO2: 24.5 mmol/L (ref 0–100)
TCO2: 25.5 mmol/L (ref 0–100)
pCO2 arterial: 47.3 mmHg — ABNORMAL HIGH (ref 35.0–45.0)
pH, Arterial: 7.404 (ref 7.350–7.450)

## 2014-05-03 LAB — POCT I-STAT 4, (NA,K, GLUC, HGB,HCT)
GLUCOSE: 180 mg/dL — AB (ref 70–99)
HEMATOCRIT: 32 % — AB (ref 36.0–46.0)
Hemoglobin: 10.9 g/dL — ABNORMAL LOW (ref 12.0–15.0)
Potassium: 4.3 mEq/L (ref 3.7–5.3)
Sodium: 138 mEq/L (ref 137–147)

## 2014-05-03 LAB — MAGNESIUM: Magnesium: 1.7 mg/dL (ref 1.5–2.5)

## 2014-05-03 LAB — PROTIME-INR
INR: 1.2 (ref 0.00–1.49)
PROTHROMBIN TIME: 14.9 s (ref 11.6–15.2)

## 2014-05-03 LAB — APTT: aPTT: 27 seconds (ref 24–37)

## 2014-05-03 LAB — PREPARE RBC (CROSSMATCH)

## 2014-05-03 SURGERY — ANEURYSM ABDOMINAL AORTIC REPAIR
Anesthesia: General

## 2014-05-03 MED ORDER — DEXTROSE 5 % IV SOLN
10.0000 mg | INTRAVENOUS | Status: DC | PRN
  Administered 2014-05-03: 25 ug/min via INTRAVENOUS

## 2014-05-03 MED ORDER — ACETAMINOPHEN 325 MG PO TABS: 325.0000 mg | ORAL_TABLET | ORAL | Status: DC | PRN

## 2014-05-03 MED ORDER — ONDANSETRON HCL 4 MG/2ML IJ SOLN
INTRAMUSCULAR | Status: DC | PRN
  Administered 2014-05-03: 4 mg via INTRAVENOUS

## 2014-05-03 MED ORDER — GLYCOPYRROLATE 0.2 MG/ML IJ SOLN
INTRAMUSCULAR | Status: DC | PRN
  Administered 2014-05-03: .8 mg via INTRAVENOUS

## 2014-05-03 MED ORDER — FENTANYL CITRATE 0.05 MG/ML IJ SOLN
INTRAMUSCULAR | Status: AC
  Filled 2014-05-03: qty 5

## 2014-05-03 MED ORDER — MIDAZOLAM HCL 2 MG/2ML IJ SOLN
INTRAMUSCULAR | Status: AC
  Filled 2014-05-03: qty 2

## 2014-05-03 MED ORDER — ROCURONIUM BROMIDE 100 MG/10ML IV SOLN
INTRAVENOUS | Status: DC | PRN
  Administered 2014-05-03: 50 mg via INTRAVENOUS

## 2014-05-03 MED ORDER — ONDANSETRON HCL 4 MG/2ML IJ SOLN
INTRAMUSCULAR | Status: AC
  Filled 2014-05-03: qty 2

## 2014-05-03 MED ORDER — GUAIFENESIN-DM 100-10 MG/5ML PO SYRP: 15.0000 mL | ORAL_SOLUTION | ORAL | Status: DC | PRN

## 2014-05-03 MED ORDER — PROPOFOL 10 MG/ML IV BOLUS
INTRAVENOUS | Status: AC
  Filled 2014-05-03: qty 20

## 2014-05-03 MED ORDER — LACTATED RINGERS IV SOLN
INTRAVENOUS | Status: DC | PRN
  Administered 2014-05-03 (×3): via INTRAVENOUS

## 2014-05-03 MED ORDER — SODIUM CHLORIDE 0.9 % IV SOLN: 500.0000 mL | Freq: Once | INTRAVENOUS | Status: AC | PRN

## 2014-05-03 MED ORDER — DOCUSATE SODIUM 100 MG PO CAPS
100.0000 mg | ORAL_CAPSULE | Freq: Every day | ORAL | Status: DC
  Administered 2014-05-07: 100 mg via ORAL
  Filled 2014-05-03 (×3): qty 1

## 2014-05-03 MED ORDER — HEPARIN SODIUM (PORCINE) 1000 UNIT/ML IJ SOLN
INTRAMUSCULAR | Status: DC | PRN
  Administered 2014-05-03: 2000 [IU] via INTRAVENOUS
  Administered 2014-05-03: 7000 [IU] via INTRAVENOUS

## 2014-05-03 MED ORDER — MAGNESIUM SULFATE 40 MG/ML IJ SOLN: 2.0000 g | Freq: Once | INTRAMUSCULAR | Status: AC | PRN

## 2014-05-03 MED ORDER — 0.9 % SODIUM CHLORIDE (POUR BTL) OPTIME
TOPICAL | Status: DC | PRN
  Administered 2014-05-03: 3000 mL

## 2014-05-03 MED ORDER — DOPAMINE-DEXTROSE 3.2-5 MG/ML-% IV SOLN
INTRAVENOUS | Status: DC | PRN
  Administered 2014-05-03: 5 ug/kg/min via INTRAVENOUS

## 2014-05-03 MED ORDER — PANTOPRAZOLE SODIUM 40 MG IV SOLR
40.0000 mg | Freq: Every day | INTRAVENOUS | Status: DC
  Administered 2014-05-03 – 2014-05-05 (×3): 40 mg via INTRAVENOUS
  Filled 2014-05-03 (×6): qty 40

## 2014-05-03 MED ORDER — LIDOCAINE HCL (CARDIAC) 20 MG/ML IV SOLN
INTRAVENOUS | Status: AC
  Filled 2014-05-03: qty 5

## 2014-05-03 MED ORDER — MEPERIDINE HCL 25 MG/ML IJ SOLN: 6.2500 mg | INTRAMUSCULAR | Status: DC | PRN

## 2014-05-03 MED ORDER — SODIUM CHLORIDE 0.9 % IR SOLN
Status: DC | PRN
  Administered 2014-05-03: 09:00:00

## 2014-05-03 MED ORDER — ROCURONIUM BROMIDE 50 MG/5ML IV SOLN
INTRAVENOUS | Status: AC
  Filled 2014-05-03: qty 1

## 2014-05-03 MED ORDER — METOPROLOL TARTRATE 1 MG/ML IV SOLN
2.0000 mg | INTRAVENOUS | Status: DC | PRN
  Administered 2014-05-03: 5 mg via INTRAVENOUS
  Filled 2014-05-03: qty 5

## 2014-05-03 MED ORDER — OXYCODONE HCL 5 MG PO TABS: 5.0000 mg | ORAL_TABLET | Freq: Once | ORAL | Status: DC | PRN

## 2014-05-03 MED ORDER — PROPOFOL 10 MG/ML IV BOLUS
INTRAVENOUS | Status: DC | PRN
  Administered 2014-05-03: 100 mg via INTRAVENOUS
  Administered 2014-05-03: 50 mg via INTRAVENOUS

## 2014-05-03 MED ORDER — NITROGLYCERIN IN D5W 200-5 MCG/ML-% IV SOLN
INTRAVENOUS | Status: DC | PRN
  Administered 2014-05-03: 5 ug/min via INTRAVENOUS

## 2014-05-03 MED ORDER — ACETAMINOPHEN 650 MG RE SUPP
325.0000 mg | RECTAL | Status: DC | PRN
  Filled 2014-05-03: qty 1

## 2014-05-03 MED ORDER — FENTANYL CITRATE 0.05 MG/ML IJ SOLN
INTRAMUSCULAR | Status: DC | PRN
  Administered 2014-05-03 (×2): 50 ug via INTRAVENOUS
  Administered 2014-05-03: 100 ug via INTRAVENOUS
  Administered 2014-05-03 (×2): 50 ug via INTRAVENOUS
  Administered 2014-05-03 (×2): 100 ug via INTRAVENOUS
  Administered 2014-05-03 (×5): 50 ug via INTRAVENOUS
  Administered 2014-05-03: 100 ug via INTRAVENOUS
  Administered 2014-05-03: 50 ug via INTRAVENOUS
  Administered 2014-05-03: 100 ug via INTRAVENOUS

## 2014-05-03 MED ORDER — LACTATED RINGERS IV SOLN
INTRAVENOUS | Status: DC | PRN
  Administered 2014-05-03 (×2): via INTRAVENOUS

## 2014-05-03 MED ORDER — ALUM & MAG HYDROXIDE-SIMETH 200-200-20 MG/5ML PO SUSP: 15.0000 mL | ORAL | Status: DC | PRN

## 2014-05-03 MED ORDER — KCL IN DEXTROSE-NACL 20-5-0.45 MEQ/L-%-% IV SOLN
INTRAVENOUS | Status: AC
  Filled 2014-05-03: qty 1000

## 2014-05-03 MED ORDER — ONDANSETRON HCL 4 MG/2ML IJ SOLN: 4.0000 mg | Freq: Once | INTRAMUSCULAR | Status: DC | PRN

## 2014-05-03 MED ORDER — PHENOL 1.4 % MT LIQD: 1.0000 | OROMUCOSAL | Status: DC | PRN

## 2014-05-03 MED ORDER — LACTATED RINGERS IV SOLN
INTRAVENOUS | Status: DC | PRN
  Administered 2014-05-03 (×2): via INTRAVENOUS

## 2014-05-03 MED ORDER — HYDROMORPHONE HCL PF 1 MG/ML IJ SOLN
0.2500 mg | INTRAMUSCULAR | Status: DC | PRN
  Administered 2014-05-03 (×4): 0.5 mg via INTRAVENOUS

## 2014-05-03 MED ORDER — VECURONIUM BROMIDE 10 MG IV SOLR
INTRAVENOUS | Status: DC | PRN
  Administered 2014-05-03: 2 mg via INTRAVENOUS
  Administered 2014-05-03: 3 mg via INTRAVENOUS
  Administered 2014-05-03: 1 mg via INTRAVENOUS
  Administered 2014-05-03 (×2): 2 mg via INTRAVENOUS

## 2014-05-03 MED ORDER — NEOSTIGMINE METHYLSULFATE 10 MG/10ML IV SOLN
INTRAVENOUS | Status: DC | PRN
  Administered 2014-05-03: 5 mg via INTRAVENOUS

## 2014-05-03 MED ORDER — POTASSIUM CHLORIDE CRYS ER 20 MEQ PO TBCR: 20.0000 meq | EXTENDED_RELEASE_TABLET | Freq: Once | ORAL | Status: AC | PRN

## 2014-05-03 MED ORDER — PANTOPRAZOLE SODIUM 40 MG PO TBEC: 40.0000 mg | DELAYED_RELEASE_TABLET | Freq: Every day | ORAL | Status: DC

## 2014-05-03 MED ORDER — MANNITOL 25 % IV SOLN
INTRAVENOUS | Status: DC | PRN
  Administered 2014-05-03: 25 g via INTRAVENOUS

## 2014-05-03 MED ORDER — HYDRALAZINE HCL 20 MG/ML IJ SOLN: 10.0000 mg | INTRAMUSCULAR | Status: DC | PRN

## 2014-05-03 MED ORDER — VANCOMYCIN HCL IN DEXTROSE 1-5 GM/200ML-% IV SOLN
1000.0000 mg | Freq: Two times a day (BID) | INTRAVENOUS | Status: AC
  Administered 2014-05-03 – 2014-05-04 (×2): 1000 mg via INTRAVENOUS
  Filled 2014-05-03 (×2): qty 200

## 2014-05-03 MED ORDER — LABETALOL HCL 5 MG/ML IV SOLN
10.0000 mg | INTRAVENOUS | Status: DC | PRN
  Filled 2014-05-03: qty 4

## 2014-05-03 MED ORDER — ONDANSETRON HCL 4 MG/2ML IJ SOLN
4.0000 mg | Freq: Four times a day (QID) | INTRAMUSCULAR | Status: DC | PRN
  Administered 2014-05-03 – 2014-05-04 (×2): 4 mg via INTRAVENOUS
  Filled 2014-05-03 (×2): qty 2

## 2014-05-03 MED ORDER — PROTAMINE SULFATE 10 MG/ML IV SOLN
INTRAVENOUS | Status: DC | PRN
  Administered 2014-05-03: 10 mg via INTRAVENOUS
  Administered 2014-05-03 (×2): 20 mg via INTRAVENOUS
  Administered 2014-05-03: 40 mg via INTRAVENOUS
  Administered 2014-05-03: 10 mg via INTRAVENOUS

## 2014-05-03 MED ORDER — SODIUM CHLORIDE 0.9 % IV SOLN
INTRAVENOUS | Status: DC | PRN
  Administered 2014-05-03: 12:00:00 via INTRAVENOUS

## 2014-05-03 MED ORDER — MIDAZOLAM HCL 5 MG/5ML IJ SOLN
INTRAMUSCULAR | Status: DC | PRN
  Administered 2014-05-03 (×2): 1 mg via INTRAVENOUS

## 2014-05-03 MED ORDER — OXYCODONE HCL 5 MG/5ML PO SOLN: 5.0000 mg | Freq: Once | ORAL | Status: DC | PRN

## 2014-05-03 MED ORDER — KCL IN DEXTROSE-NACL 20-5-0.45 MEQ/L-%-% IV SOLN
INTRAVENOUS | Status: DC
  Administered 2014-05-04: 01:00:00 via INTRAVENOUS
  Administered 2014-05-04: 100 mL/h via INTRAVENOUS
  Administered 2014-05-04 – 2014-05-05 (×2): via INTRAVENOUS
  Administered 2014-05-05: 100 mL/h via INTRAVENOUS
  Administered 2014-05-06 – 2014-05-08 (×5): via INTRAVENOUS
  Filled 2014-05-03 (×13): qty 1000

## 2014-05-03 MED ORDER — HYDROMORPHONE HCL PF 1 MG/ML IJ SOLN
INTRAMUSCULAR | Status: AC
  Administered 2014-05-03: 0.5 mg via INTRAVENOUS
  Filled 2014-05-03: qty 2

## 2014-05-03 MED ORDER — HYDROMORPHONE HCL PF 1 MG/ML IJ SOLN
1.0000 mg | INTRAMUSCULAR | Status: DC | PRN
  Administered 2014-05-03 – 2014-05-07 (×28): 1 mg via INTRAVENOUS
  Filled 2014-05-03 (×28): qty 1

## 2014-05-03 MED ORDER — MORPHINE SULFATE 2 MG/ML IJ SOLN
2.0000 mg | INTRAMUSCULAR | Status: DC | PRN
  Administered 2014-05-03: 2 mg via INTRAVENOUS
  Administered 2014-05-03: 4 mg via INTRAVENOUS
  Administered 2014-05-03: 2 mg via INTRAVENOUS
  Administered 2014-05-03 – 2014-05-04 (×6): 4 mg via INTRAVENOUS
  Administered 2014-05-04: 2 mg via INTRAVENOUS
  Filled 2014-05-03 (×2): qty 2
  Filled 2014-05-03: qty 1
  Filled 2014-05-03 (×7): qty 2

## 2014-05-03 SURGICAL SUPPLY — 59 items
BLADE 10 SAFETY STRL DISP (BLADE) ×3 IMPLANT
CANISTER SUCTION 2500CC (MISCELLANEOUS) ×3 IMPLANT
CLIP TI MEDIUM 24 (CLIP) ×3 IMPLANT
CLIP TI WIDE RED SMALL 24 (CLIP) ×3 IMPLANT
COVER SURGICAL LIGHT HANDLE (MISCELLANEOUS) ×3 IMPLANT
DRAPE WARM FLUID 44X44 (DRAPE) ×3 IMPLANT
DRSG COVADERM 4X14 (GAUZE/BANDAGES/DRESSINGS) ×3 IMPLANT
ELECT BLADE 4.0 EZ CLEAN MEGAD (MISCELLANEOUS) ×3
ELECT BLADE 6.5 EXT (BLADE) IMPLANT
ELECT REM PT RETURN 9FT ADLT (ELECTROSURGICAL) ×3
ELECTRODE BLDE 4.0 EZ CLN MEGD (MISCELLANEOUS) ×1 IMPLANT
ELECTRODE REM PT RTRN 9FT ADLT (ELECTROSURGICAL) ×1 IMPLANT
FELT TEFLON 4 X1 (Mesh General) ×3 IMPLANT
GLOVE BIO SURGEON STRL SZ 6.5 (GLOVE) ×2 IMPLANT
GLOVE BIO SURGEONS STRL SZ 6.5 (GLOVE) ×1
GLOVE BIOGEL PI IND STRL 7.0 (GLOVE) ×2 IMPLANT
GLOVE BIOGEL PI INDICATOR 7.0 (GLOVE) ×4
GLOVE SS BIOGEL STRL SZ 7 (GLOVE) ×1 IMPLANT
GLOVE SUPERSENSE BIOGEL SZ 7 (GLOVE) ×2
GLOVE SURG SS PI 7.0 STRL IVOR (GLOVE) ×6 IMPLANT
GOWN STRL REUS W/ TWL LRG LVL3 (GOWN DISPOSABLE) ×3 IMPLANT
GOWN STRL REUS W/TWL LRG LVL3 (GOWN DISPOSABLE) ×9
GRAFT HEMASHIELD 18X9MM (Vascular Products) ×3 IMPLANT
INSERT FOGARTY 61MM (MISCELLANEOUS) IMPLANT
INSERT FOGARTY SM (MISCELLANEOUS) IMPLANT
KIT BASIN OR (CUSTOM PROCEDURE TRAY) ×3 IMPLANT
KIT ROOM TURNOVER OR (KITS) ×3 IMPLANT
LOOP VESSEL MAXI BLUE (MISCELLANEOUS) IMPLANT
LOOP VESSEL MINI RED (MISCELLANEOUS) IMPLANT
NS IRRIG 1000ML POUR BTL (IV SOLUTION) ×6 IMPLANT
PACK AORTA (CUSTOM PROCEDURE TRAY) ×3 IMPLANT
PAD ARMBOARD 7.5X6 YLW CONV (MISCELLANEOUS) ×6 IMPLANT
SPONGE LAP 18X18 X RAY DECT (DISPOSABLE) IMPLANT
SPONGE LAP 4X18 X RAY DECT (DISPOSABLE) ×3 IMPLANT
STAPLER VISISTAT 35W (STAPLE) ×6 IMPLANT
SUT ETHIBOND 5 LR DA (SUTURE) IMPLANT
SUT PROLENE 1 XLH 60 (SUTURE) ×6 IMPLANT
SUT PROLENE 3 0 SH1 36 (SUTURE) ×6 IMPLANT
SUT PROLENE 4 0 RB 1 (SUTURE)
SUT PROLENE 4-0 RB1 .5 CRCL 36 (SUTURE) IMPLANT
SUT PROLENE 5 0 CC 1 (SUTURE) IMPLANT
SUT PROLENE 5 0 CC1 (SUTURE) ×6 IMPLANT
SUT PROLENE 6 0 C 1 30 (SUTURE) ×3 IMPLANT
SUT SILK 2 0 (SUTURE) ×3
SUT SILK 2 0 SH CR/8 (SUTURE) ×3 IMPLANT
SUT SILK 2-0 18XBRD TIE 12 (SUTURE) ×1 IMPLANT
SUT SILK 3 0 (SUTURE) ×3
SUT SILK 3 0 SH CR/8 (SUTURE) IMPLANT
SUT SILK 3-0 18XBRD TIE 12 (SUTURE) ×1 IMPLANT
SUT VIC AB 2-0 CTX 36 (SUTURE) IMPLANT
SUT VIC AB 3-0 MH 27 (SUTURE) ×9 IMPLANT
SUT VIC AB 3-0 SH 27 (SUTURE)
SUT VIC AB 3-0 SH 27X BRD (SUTURE) IMPLANT
TOWEL OR 17X24 6PK STRL BLUE (TOWEL DISPOSABLE) ×6 IMPLANT
TOWEL OR 17X26 10 PK STRL BLUE (TOWEL DISPOSABLE) ×6 IMPLANT
TRAY FOLEY CATH 16FRSI W/METER (SET/KITS/TRAYS/PACK) ×3 IMPLANT
TUBE CONNECTING 12'X1/4 (SUCTIONS) ×1
TUBE CONNECTING 12X1/4 (SUCTIONS) ×2 IMPLANT
WATER STERILE IRR 1000ML POUR (IV SOLUTION) ×6 IMPLANT

## 2014-05-03 NOTE — Transfer of Care (Signed)
Immediate Anesthesia Transfer of Care Note  Patient: Julie Holder  Procedure(s) Performed: Procedure(s): RESECTION AND GRAFTING ANEURYSM ABDOMINAL AORTIC WITH BILATERAL COMMON ILIAC GRAFTING USING 18X9 HEMASHIELD GRAFT (N/A)  Patient Location: PACU  Anesthesia Type:General  Level of Consciousness: awake, patient cooperative and responds to stimulation  Airway & Oxygen Therapy: Patient Spontanous Breathing and Patient connected to nasal cannula oxygen  Post-op Assessment: Report given to PACU RN, Post -op Vital signs reviewed and stable and Patient moving all extremities X 4  Post vital signs: Reviewed and stable  Complications: No apparent anesthesia complications

## 2014-05-03 NOTE — Anesthesia Postprocedure Evaluation (Signed)
Anesthesia Post Note  Patient: Julie Holder  Procedure(s) Performed: Procedure(s) (LRB): RESECTION AND GRAFTING ANEURYSM ABDOMINAL AORTIC WITH BILATERAL COMMON ILIAC GRAFTING USING 18X9 HEMASHIELD GRAFT (N/A)  Anesthesia type: general  Patient location: PACU  Post pain: Pain level controlled  Post assessment: Patient's Cardiovascular Status Stable  Last Vitals:  Filed Vitals:   05/03/14 1800  BP: 138/72  Pulse: 87  Temp: 37 C  Resp: 17    Post vital signs: Reviewed and stable  Level of consciousness: sedated  Complications: No apparent anesthesia complications

## 2014-05-03 NOTE — Plan of Care (Signed)
Problem: Consults Goal: Diagnosis CEA/CES/AAA Stent Abdominal Aortic Aneurysm Stent (AAA)     

## 2014-05-03 NOTE — H&P (View-Only) (Signed)
Subjective:     Patient ID: Julie Holder, female   DOB: 04/13/1961, 52 y.o.   MRN: [id number]  HPI this 52-year-old female returns today for further discussion about her large abdominal aortic aneurysm. She had a CT angiogram performed on May 8 which I have reviewed the computer. Aneurysm measure 6.3 cm in maximum diameter and extends up to the renal arteries with a very short-less than 5 mm-neck below the renal arteries. 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 Patient denies any new symptoms of abdominal or back pain. She has no history of coronary artery disease, CVA, TIAs, diabetes mellitus. She stopped smoking 5 years ago and denies COPD   Review of Systems     Objective:   Physical Exam BP 129/78  Pulse 76  Resp 16  Ht 5' 5" (1.651 m)  Wt 258 lb (117.028 kg)  BMI 42.93 kg/m2  LMP 05/31/2011  Gen. 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 This would require either percutaneous or possibly open femoral artery exposure and through areas of chronic suppurative hidradenitis Do not feel this is a good option    Plan:     Plan open resection and grafting of abdominal aortic aneurysm and insertion aortobiiliac graft on  Wednesday, May 13-patient would like to proceed as soon as possible Risks and benefits including 2-3% operative mortality discussed with patient and she would like to proceed      

## 2014-05-03 NOTE — Interval H&P Note (Signed)
History and Physical Interval Note:  05/03/2014 8:00 AM  Julie Holder  has presented today for surgery, with the diagnosis of  Abdominal aneurysm without mention of rupture  The various methods of treatment have been discussed with the patient and family. After consideration of risks, benefits and other options for treatment, the patient has consented to  Procedure(s): ANEURYSM ABDOMINAL AORTIC REPAIR (N/A) as a surgical intervention .  The patient's history has been reviewed, patient examined, no change in status, stable for surgery.  I have reviewed the patient's chart and labs.  Questions were answered to the patient's satisfaction.     Mal Misty

## 2014-05-03 NOTE — Op Note (Signed)
OPERATIVE REPORT  Date of Surgery: 05/03/2014  Surgeon: Tinnie Gens, MD  Assistant: Dr. Sherren Mocha early, Aldona Bar Rhyne,PA  Pre-op Diagnosis:  Abdominal aneurysm without mention of rupture  Post-op Diagnosis:  Abdominal aneurysm without mention of rupture  Procedure: Procedure(s): RESECTION AND GRAFTING ANEURYSM ABDOMINAL AORTIC WITH insertion of aorto by common iliac graft using 18 x 9 mm Hemashield Dacron graft  Anesthesia: General  EBL: 240 cc  Complications: None  Patient was taken operating room placed in supine position at which time satisfactory general endotracheal anesthesia was ministered. Swan-Ganz catheter radial arterial line were inserted by anesthesia preop. Abdomen and groins were prepped with Betadine scrub and solution draped in routine sterile manner. Midline incision was made from xiphoid to pubis carried down through subcutaneous tissue and linea alba.  Cavity was entered and thoroughly explored. The stomach duodenum small bowel colon were unremarkable. Liver smooth no palpable masses. Gallbladder appeared normal no stones were palpable. Uterus and adnexa were grossly normal. The intestines were reflected to the right side retroperitoneum incised exposing a large infrarenal normal aortic aneurysm measuring between 6 and 7 cm in diameter. Common iliac arteries were normal caliber on the left slightly dilated on the right but not truly aneurysmal. These were dissected free encircled with vessel loops. 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 Neck of the aneurysm was dissected free up to the renal arteries. There was a very short infrarenal neck. Left renal vein was mobilized to assist in this. The patient was then heparinized and 25 g mannitol. Aorta was occluded distal to the renal arteries and both common iliac arteries occluded with vascular clamps. Longitudinal opening made in the aneurysm and a lot of degenerative thrombotic debris was removed. There was very  brisk bleeding coming from 2 lumbars which were oversewn with 2-0 silk figure-of-eight sutures. Following this the neck of the aneurysm was transected. Endarterectomy was not necessary. An 18 x 9 mm Hemashield Dacron graft was anastomosed end-to-end to the aortic stump using continuous 3-0 Prolene buttressing this with a strip of felt. This is checked for leaks none were present. Common iliacs were divided bilaterally and slightly beveled anteriorly. There was excellent backbleeding. Limbs were then anastomosed end to end bilaterally after beveling the graft slightly and this was done with 5-0 Prolene. Left leg was done initially followed by the right leg and following appropriate flushing both legs were opened with no significant hypotension. Protamine was then given to reverse the heparin. There was excellent Doppler flow in both iliac arteries and both renal arteries. Adequate hemostasis was achieved the aneurysm sac closed over the graft continuous 3-0 Vicryl retroperitoneum approximated with 3-0 Vicryl thorough irrigation peritoneal cavity was performed and linea alba closed #1 Prolene and skin with staples sterile dressing applied. Patient was taken to the recovery room in stable condition she received one unit of packed red blood cells and 300 cc of Cell Saver blood and was stable hemodynamically throughout the case  Procedure Details:   Tinnie Gens, MD 05/03/2014 12:10 PM

## 2014-05-03 NOTE — Anesthesia Procedure Notes (Addendum)
Procedure Name: Intubation Date/Time: 05/03/2014 8:52 AM Performed by: Judeth Cornfield T Pre-anesthesia Checklist: Patient identified, Timeout performed, Emergency Drugs available, Suction available and Patient being monitored Patient Re-evaluated:Patient Re-evaluated prior to inductionOxygen Delivery Method: Circle system utilized Preoxygenation: Pre-oxygenation with 100% oxygen Intubation Type: IV induction Ventilation: Mask ventilation without difficulty Grade View: Grade II Tube type: Oral Tube size: 7.0 mm Number of attempts: 1 Airway Equipment and Method: Stylet and Bite block Placement Confirmation: ETT inserted through vocal cords under direct vision,  positive ETCO2 and breath sounds checked- equal and bilateral Secured at: 21 cm Tube secured with: Tape Dental Injury: Teeth and Oropharynx as per pre-operative assessment

## 2014-05-03 NOTE — Progress Notes (Signed)
Dr.ossey at bedside to eval patient, pt ok to go to ICU

## 2014-05-03 NOTE — Progress Notes (Signed)
Pt requested to be made a XXX admitting called and informed.

## 2014-05-03 NOTE — Anesthesia Preprocedure Evaluation (Signed)
Anesthesia Evaluation  Patient identified by MRN, date of birth, ID band Patient awake    Reviewed: Allergy & Precautions, H&P , NPO status , Patient's Chart, lab work & pertinent test results  Airway Mallampati: II TM Distance: >3 FB Neck ROM: Full    Dental   Pulmonary former smoker,          Cardiovascular hypertension, Pt. on medications + Peripheral Vascular Disease     Neuro/Psych    GI/Hepatic   Endo/Other    Renal/GU      Musculoskeletal   Abdominal   Peds  Hematology   Anesthesia Other Findings   Reproductive/Obstetrics                           Anesthesia Physical Anesthesia Plan  ASA: III  Anesthesia Plan: General   Post-op Pain Management:    Induction: Intravenous  Airway Management Planned: Oral ETT  Additional Equipment: Arterial line, CVP, PA Cath and Ultrasound Guidance Line Placement  Intra-op Plan:   Post-operative Plan: Possible Post-op intubation/ventilation  Informed Consent: I have reviewed the patients History and Physical, chart, labs and discussed the procedure including the risks, benefits and alternatives for the proposed anesthesia with the patient or authorized representative who has indicated his/her understanding and acceptance.     Plan Discussed with: CRNA and Surgeon  Anesthesia Plan Comments:         Anesthesia Quick Evaluation

## 2014-05-04 ENCOUNTER — Inpatient Hospital Stay (HOSPITAL_COMMUNITY): Payer: 59

## 2014-05-04 LAB — POCT I-STAT 3, ART BLOOD GAS (G3+)
Bicarbonate: 26.5 mEq/L — ABNORMAL HIGH (ref 20.0–24.0)
O2 Saturation: 91 %
PCO2 ART: 50.9 mmHg — AB (ref 35.0–45.0)
PH ART: 7.328 — AB (ref 7.350–7.450)
Patient temperature: 37.9
TCO2: 28 mmol/L (ref 0–100)
pO2, Arterial: 69 mmHg — ABNORMAL LOW (ref 80.0–100.0)

## 2014-05-04 LAB — COMPREHENSIVE METABOLIC PANEL
ALBUMIN: 2.6 g/dL — AB (ref 3.5–5.2)
ALT: 14 U/L (ref 0–35)
AST: 16 U/L (ref 0–37)
Alkaline Phosphatase: 62 U/L (ref 39–117)
BUN: 13 mg/dL (ref 6–23)
CALCIUM: 8.2 mg/dL — AB (ref 8.4–10.5)
CO2: 24 mEq/L (ref 19–32)
Chloride: 101 mEq/L (ref 96–112)
Creatinine, Ser: 0.94 mg/dL (ref 0.50–1.10)
GFR calc Af Amer: 79 mL/min — ABNORMAL LOW (ref >90)
GFR calc non Af Amer: 68 mL/min — ABNORMAL LOW (ref >90)
Glucose, Bld: 137 mg/dL — ABNORMAL HIGH (ref 70–99)
Potassium: 4.8 mEq/L (ref 3.7–5.3)
SODIUM: 136 meq/L — AB (ref 137–147)
Total Bilirubin: 0.2 mg/dL — ABNORMAL LOW (ref 0.3–1.2)
Total Protein: 6.7 g/dL (ref 6.0–8.3)

## 2014-05-04 LAB — CBC
HCT: 29.5 % — ABNORMAL LOW (ref 36.0–46.0)
Hemoglobin: 9.9 g/dL — ABNORMAL LOW (ref 12.0–15.0)
MCH: 28 pg (ref 26.0–34.0)
MCHC: 33.6 g/dL (ref 30.0–36.0)
MCV: 83.6 fL (ref 78.0–100.0)
PLATELETS: 255 10*3/uL (ref 150–400)
RBC: 3.53 MIL/uL — ABNORMAL LOW (ref 3.87–5.11)
RDW: 16.6 % — ABNORMAL HIGH (ref 11.5–15.5)
WBC: 12.3 10*3/uL — AB (ref 4.0–10.5)

## 2014-05-04 LAB — AMYLASE: Amylase: 45 U/L (ref 0–105)

## 2014-05-04 LAB — MAGNESIUM: Magnesium: 1.9 mg/dL (ref 1.5–2.5)

## 2014-05-04 MED FILL — Heparin Sodium (Porcine) Inj 1000 Unit/ML: INTRAMUSCULAR | Qty: 30 | Status: AC

## 2014-05-04 MED FILL — Sodium Chloride IV Soln 0.9%: INTRAVENOUS | Qty: 1000 | Status: AC

## 2014-05-04 NOTE — Progress Notes (Addendum)
  [facility] AAA Progress Note  05/04/2014 7:21 AM 1 Day Post-Op  Subjective:  C/o pain in her belly  Tm 100.4-low grade fever most of the evening. 120's-140's most of the night HR 70's-80's regular 96% 2LO2NC  No pressor support required.  Filed Vitals:   05/04/14 0700  BP: 131/62  Pulse: 87  Temp: 100 F (37.8 C)  Resp: 31    Physical Exam: Cardiac:  Regular Lungs:  CTAB Abdomen:  Soft, non tender, no BS, no flatus Incisions:  Covered-dry Extremities:  1+ DP bilaterally  CBC    Component Value Date/Time   WBC 12.3* 05/04/2014 0400   RBC 3.53* 05/04/2014 0400   HGB 9.9* 05/04/2014 0400   HCT 29.5* 05/04/2014 0400   PLT 255 05/04/2014 0400   MCV 83.6 05/04/2014 0400   MCH 28.0 05/04/2014 0400   MCHC 33.6 05/04/2014 0400   RDW 16.6* 05/04/2014 0400   LYMPHSABS 3.2 04/17/2014 1021   MONOABS 0.5 04/17/2014 1021   EOSABS 0.2 04/17/2014 1021   BASOSABS 0.1 04/17/2014 1021    BMET    Component Value Date/Time   NA 136* 05/04/2014 0400   K 4.8 05/04/2014 0400   CL 101 05/04/2014 0400   CO2 24 05/04/2014 0400   GLUCOSE 137* 05/04/2014 0400   BUN 13 05/04/2014 0400   CREATININE 0.94 05/04/2014 0400   CALCIUM 8.2* 05/04/2014 0400   GFRNONAA 68* 05/04/2014 0400   GFRAA 79* 05/04/2014 0400    INR    Component Value Date/Time   INR 1.20 05/03/2014 1301     Intake/Output Summary (Last 24 hours) at 05/04/14 0721 Last data filed at 05/04/14 0700  Gross per 24 hour  Intake   8195 ml  Output   3660 ml  Net   4535 ml     Assessment/Plan:  53 y.o. female is s/p  Resection and grafting AAA with insertion of aorto-bi-common iliac graft 1 Day Post-Op  -pt doing well this am with the exception of pain is not controlled.  She did take Morphine 16 mg last pm and dilaudid 3 mg.  May benefit from PCA early in post operative course.  Abdomen is soft and non tender to palpation. -has not required any pressor support. -BUN/Cr normal with good UOP -per RN, NGT is not  suctioning correctly.  Pt with occasional nausea-consider d/c of NGT and continue strict NPO-will d/w Dr. Kellie Simmering. -acute blood loss anemia-received one unit PRBC's in OR; received 320 cc cellsaver in OR. -OOB to chair today and continue IS.  Pt states that she is ready for both.   Leontine Locket, PA-C [facility] [phone] 05/04/2014 7:21 AM  Agree with above assessment Patient alert and oriented x3 Abdomen relatively soft 2+ dorsalis pedis pulse palpable bilaterally Nasogastric tube with minimal drainage-irrigating well-we will DC and keep strict n.p.o. Hemodynamically stable-we will DC Swan-Ganz catheter today and arterial line tomorrow following blood work Out of bed today with pulmonary toilet  Generally doing well hopefully will transfer to 2 S. Tomorrow/ If difficulty continues with pain control after changing to Dilaudid-okay to use PCA

## 2014-05-04 NOTE — Evaluation (Signed)
Physical Therapy Evaluation Patient Details Name: Julie Holder MRN: [id number] DOB: 09/18/1961 Today's Date: 05/04/2014   History of Present Illness  Pt adm 05/03/14 for repair of AAA. 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 PMH- HTN  Clinical Impression  Pt admitted with above. Pt currently with functional limitations due to the deficits listed below (see PT Problem List).  Pt will benefit from skilled PT to increase their independence and safety with mobility to allow discharge to the venue listed below.       Follow Up Recommendations Home health PT;Supervision/Assistance - 24 hour (if she progresses steadily. If progress slow may need to consider CIR)    Equipment Recommendations  Rolling walker with 5" wheels    Recommendations for Other Services       Precautions / Restrictions Precautions Precautions: Fall      Mobility  Bed Mobility Overal bed mobility: Needs Assistance Bed Mobility: Supine to Sit     Supine to sit: +2 for physical assistance;Mod assist     General bed mobility comments: Assist to bring legs off and to bring trunk up.  Transfers Overall transfer level: Needs assistance Equipment used: 2 person hand held assist Transfers: Sit to/from [facility] Sit to Stand: +2 physical assistance;Mod assist Stand pivot transfers: +2 physical assistance;Min assist       General transfer comment: Assist to bring hips up and assist to pivot to chair.  Ambulation/Gait                Stairs            Wheelchair Mobility    Modified Rankin (Stroke Patients Only)       Balance Overall balance assessment: Needs assistance Sitting-balance support: Bilateral upper extremity supported;Feet supported Sitting balance-Leahy Scale: Poor Sitting balance - Comments: Pt sat EOB x 10 minutes. Posterior lean likely due to incisional pain. Postural control: Posterior lean                                    Pertinent Vitals/Pain VSS. Pain 4/10 incisional. Pt premedicated.    Home Living Family/patient expects to be discharged to:: Private residence Living Arrangements: Other relatives (brother) Available Help at Discharge: Family;Available PRN/intermittently Type of Home: House Home Access: Stairs to enter Entrance Stairs-Rails: None Entrance Stairs-Number of Steps: 5 Home Layout: One level Home Equipment: None      Prior Function Level of Independence: Independent         Comments: Works as Catering manager        Extremity/Trunk Assessment   Upper Extremity Assessment: Defer to OT evaluation           Lower Extremity Assessment: Generalized weakness         Communication   Communication: No difficulties  Cognition Arousal/Alertness: Awake/alert Behavior During Therapy: WFL for tasks assessed/performed Overall Cognitive Status: Within Functional Limits for tasks assessed                      General Comments      Exercises        Assessment/Plan    PT Assessment Patient needs continued PT services  PT Diagnosis Difficulty walking;Generalized weakness   PT Problem List Decreased strength;Decreased activity tolerance;Decreased balance;Decreased mobility;Obesity  PT Treatment Interventions DME instruction;Gait training;Stair training;Functional mobility training;Therapeutic  activities;Therapeutic exercise;Balance training;Patient/family education   PT Goals (Current goals can be found in the Care Plan section) Acute Rehab PT Goals Patient Stated Goal: Get better PT Goal Formulation: With patient Time For Goal Achievement: 05/11/14 Potential to Achieve Goals: Good    Frequency Min 3X/week   Barriers to discharge        Co-evaluation               End of Session Equipment Utilized During Treatment: Other (comment) (no gait belt due to multiple lines/tubes.) Activity  Tolerance: Other (comment) (Limited by nausea) Patient left: in chair;with call bell/phone within reach;with nursing/sitter in room Nurse Communication: Mobility status         Time: 1202-1222 PT Time Calculation (min): 20 min   Charges:   PT Evaluation $Initial PT Evaluation Tier I: 1 Procedure PT Treatments $Gait Training: 8-22 mins   PT G CodesShary Holder Julie Holder 05/04/2014, 2:03 PM  [employer] PT [phone]

## 2014-05-05 ENCOUNTER — Encounter (HOSPITAL_COMMUNITY): Payer: Self-pay | Admitting: Vascular Surgery

## 2014-05-05 MED ORDER — FUROSEMIDE 10 MG/ML IJ SOLN
20.0000 mg | Freq: Once | INTRAMUSCULAR | Status: AC
  Administered 2014-05-05: 20 mg via INTRAVENOUS

## 2014-05-05 MED ORDER — BISACODYL 10 MG RE SUPP
10.0000 mg | Freq: Once | RECTAL | Status: AC
  Administered 2014-05-05: 10 mg via RECTAL
  Filled 2014-05-05: qty 1

## 2014-05-05 MED ORDER — FUROSEMIDE 10 MG/ML IJ SOLN
INTRAMUSCULAR | Status: AC
  Filled 2014-05-05: qty 2

## 2014-05-05 NOTE — Progress Notes (Addendum)
  [facility] AAA Progress Note  05/05/2014 9:10 AM 2 Days Post-Op  Subjective:  Pain is better.  Overall, feeling better  Tm 100.3 now afebrile HR 80's-120's regular (120 last night at [facility]) 174'B-449'Q systolic 75% [id number]  Filed Vitals:   05/05/14 0800  BP: 130/60  Pulse: 82  Temp:   Resp: 25    Physical Exam: Cardiac:  Regular Lungs:  CTAB Abdomen:  Soft, NT/ND +BS -flatus Incisions:  C/d/i with scant bloody drainage at umbilicus Extremities:  2+DP pulses bilaterally; bilateral feet are warm.  CBC    Component Value Date/Time   WBC 12.3* 05/04/2014 0400   RBC 3.53* 05/04/2014 0400   HGB 9.9* 05/04/2014 0400   HCT 29.5* 05/04/2014 0400   PLT 255 05/04/2014 0400   MCV 83.6 05/04/2014 0400   MCH 28.0 05/04/2014 0400   MCHC 33.6 05/04/2014 0400   RDW 16.6* 05/04/2014 0400   LYMPHSABS 3.2 04/17/2014 1021   MONOABS 0.5 04/17/2014 1021   EOSABS 0.2 04/17/2014 1021   BASOSABS 0.1 04/17/2014 1021    BMET    Component Value Date/Time   NA 136* 05/04/2014 0400   K 4.8 05/04/2014 0400   CL 101 05/04/2014 0400   CO2 24 05/04/2014 0400   GLUCOSE 137* 05/04/2014 0400   BUN 13 05/04/2014 0400   CREATININE 0.94 05/04/2014 0400   CALCIUM 8.2* 05/04/2014 0400   GFRNONAA 68* 05/04/2014 0400   GFRAA 79* 05/04/2014 0400    INR    Component Value Date/Time   INR 1.20 05/03/2014 1301     Intake/Output Summary (Last 24 hours) at 05/05/14 0910 Last data filed at 05/05/14 0900  Gross per 24 hour  Intake   2300 ml  Output   1710 ml  Net    590 ml     Assessment/Plan:  53 y.o. female is s/p  Resection and grafting AAA with insertion of aorto-bi-common iliac graft 2 Days Post-Op  -doing well this am -she is somewhat edematous and is up 7 lbs from pre-op.  Will giver her gentle diuresis this am.  Check labs in am. -minimal nausea yesterday, but no nausea since NGT is out. -has ambulated the entire unit this morning -does have bowel sounds, but no flatus-continue NPO  at this time -continue OOB to chair tid -continue IS -transfer to [place], PA-C [facility] [phone] 05/05/2014 9:10 AM  Agree with above assessment  Patient alert and oriented Abdomen obese but soft and incision healing nicely 2+ dorsalis pedis pulses palpable bilaterally Labs all stable with good urinary output-currently being diuresed Getting out of bed and into lading well-no bowel movement yet-no nausea today  Plan DCA line and transferred to 2 west  increase ambulation and pulmonary toilet  continue strict n.p.o. except for occasional ice chips Due to lack suppository Totally can begin some liquids over the next day or 2 and discharged home early to mid week

## 2014-05-05 NOTE — Progress Notes (Signed)
OT Cancellation Note  Patient Details Name: Julie Holder MRN: [id number] DOB: 08/05/1961   Cancelled Treatment:    Reason Eval/Treat Not Completed: Other (comment) Pt working with PT. Making good progress. Will follow up to assess any OT needs.  [place], [place]  [phone] 05/05/2014 05/05/2014, 6:18 PM

## 2014-05-05 NOTE — Progress Notes (Signed)
Physical Therapy Treatment Patient Details Name: Julie Holder MRN: [id number] DOB: 03/25/1961 Today's Date: 2014-05-17    History of Present Illness Pt adm 05/03/14 for repair of AAA. 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 PMH- HTN    PT Comments    Pt making excellent progress.  Follow Up Recommendations  No PT follow up;Supervision - Intermittent     Equipment Recommendations  Other (comment) (To be determined)    Recommendations for Other Services       Precautions / Restrictions Precautions Precautions: None    Mobility  Bed Mobility                  Transfers   Equipment used: None Transfers: Sit to/from Stand Sit to Stand: Min guard         General transfer comment: Assist for lines/tubes and safety  Ambulation/Gait Ambulation/Gait assistance: Supervision Ambulation Distance (Feet): 500 Feet Assistive device: None (pushing w/c) Gait Pattern/deviations: Step-through pattern;Decreased stride length;Wide base of support     General Gait Details: Pt using w/c for steadying assist.   Stairs            Wheelchair Mobility    Modified Rankin (Stroke Patients Only)       Balance           Standing balance support: No upper extremity supported Standing balance-Leahy Scale: Fair                      Cognition Arousal/Alertness: Awake/alert Behavior During Therapy: WFL for tasks assessed/performed Overall Cognitive Status: Within Functional Limits for tasks assessed                      Exercises      General Comments        Pertinent Vitals/Pain HR to 110's with activity    Home Living                      Prior Function            PT Goals (current goals can now be found in the care plan section) Progress towards PT goals: Progressing toward goals    Frequency  Min 3X/week    PT Plan Discharge plan needs to be updated     Co-evaluation             End of Session Equipment Utilized During Treatment: Other (comment) (no gait belt due to incision) Activity Tolerance: Patient tolerated treatment well Patient left: in chair;with call bell/phone within reach;with nursing/sitter in room     Time: 1225-1244 PT Time Calculation (min): 19 min  Charges:  $Gait Training: 8-22 mins                    G Codes:      Shary Decamp Megyn Leng May 17, 2014, 1:51 PM  [employer] PT [phone]

## 2014-05-05 NOTE — Progress Notes (Signed)
Pt transferred to 2W Rm 13 and report given to 2W staff, pt transported over in wheelchair on [facility] and maintenance fluid at 183ml/hr.

## 2014-05-06 LAB — BASIC METABOLIC PANEL WITH GFR
BUN: 8 mg/dL (ref 6–23)
CO2: 24 meq/L (ref 19–32)
Calcium: 8.1 mg/dL — ABNORMAL LOW (ref 8.4–10.5)
Chloride: 101 meq/L (ref 96–112)
Creatinine, Ser: 0.95 mg/dL (ref 0.50–1.10)
GFR calc Af Amer: 78 mL/min — ABNORMAL LOW
GFR calc non Af Amer: 67 mL/min — ABNORMAL LOW
Glucose, Bld: 108 mg/dL — ABNORMAL HIGH (ref 70–99)
Potassium: 4.3 meq/L (ref 3.7–5.3)
Sodium: 136 meq/L — ABNORMAL LOW (ref 137–147)

## 2014-05-06 LAB — TYPE AND SCREEN
ABO/RH(D): B POS
ANTIBODY SCREEN: NEGATIVE
UNIT DIVISION: 0
UNIT DIVISION: 0

## 2014-05-06 LAB — CBC
HCT: 25.5 % — ABNORMAL LOW (ref 36.0–46.0)
HEMOGLOBIN: 8.4 g/dL — AB (ref 12.0–15.0)
MCH: 28.2 pg (ref 26.0–34.0)
MCHC: 32.9 g/dL (ref 30.0–36.0)
MCV: 85.6 fL (ref 78.0–100.0)
Platelets: 212 10*3/uL (ref 150–400)
RBC: 2.98 MIL/uL — AB (ref 3.87–5.11)
RDW: 16.2 % — ABNORMAL HIGH (ref 11.5–15.5)
WBC: 10.7 10*3/uL — ABNORMAL HIGH (ref 4.0–10.5)

## 2014-05-06 MED ORDER — PANTOPRAZOLE SODIUM 40 MG IV SOLR
40.0000 mg | INTRAVENOUS | Status: DC
  Administered 2014-05-06: 40 mg via INTRAVENOUS
  Filled 2014-05-06 (×2): qty 40

## 2014-05-06 MED ORDER — BISACODYL 10 MG RE SUPP
10.0000 mg | Freq: Once | RECTAL | Status: AC
  Administered 2014-05-06: 10 mg via RECTAL
  Filled 2014-05-06: qty 1

## 2014-05-06 NOTE — Progress Notes (Addendum)
  [facility] AAA Progress Note  05/06/2014 9:50 AM 3 Days Post-Op  Subjective:  States she is feeling better.  Still no flatus.  Tm 99.8 now 99.6 726'O-035'D systolic 97'C-163'A regular 94% 2LO2NC  Filed Vitals:   05/06/14 0435  BP: 117/64  Pulse: 87  Temp: 99.6 F (37.6 C)  Resp: 17    Physical Exam: Cardiac:  regular Lungs:  Non labored Abdomen:  Soft, NT, +BS Incisions:  With serous drainage from umbilicus and below Extremities:  2+ DP pulses bilaterally  CBC    Component Value Date/Time   WBC 10.7* 05/06/2014 0331   RBC 2.98* 05/06/2014 0331   HGB 8.4* 05/06/2014 0331   HCT 25.5* 05/06/2014 0331   PLT 212 05/06/2014 0331   MCV 85.6 05/06/2014 0331   MCH 28.2 05/06/2014 0331   MCHC 32.9 05/06/2014 0331   RDW 16.2* 05/06/2014 0331   LYMPHSABS 3.2 04/17/2014 1021   MONOABS 0.5 04/17/2014 1021   EOSABS 0.2 04/17/2014 1021   BASOSABS 0.1 04/17/2014 1021    BMET    Component Value Date/Time   NA 136* 05/06/2014 0331   K 4.3 05/06/2014 0331   CL 101 05/06/2014 0331   CO2 24 05/06/2014 0331   GLUCOSE 108* 05/06/2014 0331   BUN 8 05/06/2014 0331   CREATININE 0.95 05/06/2014 0331   CALCIUM 8.1* 05/06/2014 0331   GFRNONAA 67* 05/06/2014 0331   GFRAA 78* 05/06/2014 0331    INR    Component Value Date/Time   INR 1.20 05/03/2014 1301     Intake/Output Summary (Last 24 hours) at 05/06/14 0950 Last data filed at 05/06/14 0700  Gross per 24 hour  Intake   1200 ml  Output   3500 ml  Net  -2300 ml     Assessment/Plan:  53 y.o. female is s/p  Resection and grafting AAA with insertion of aorto-bi-common iliac graft 3 Days Post-Op  -pt doing well this am.  She does have some serous drainage from her midline incision from umbilicus distally.  Continue to monitor -pt had good response to lasix yesterday -discontinue foley this am -dulcolax yesterday, but did not absorb-will give another dulcolax this am -ambulate in halls tid and continue to increase  ambulation -continues to use IS -check CBC in am - Hgb down from 2 days ago from 9.9 to 8.4 - will hold off on starting Lovenox    Leontine Locket, PA-C [facility] [phone] 05/06/2014 9:50 AM

## 2014-05-06 NOTE — Evaluation (Signed)
Occupational Therapy Evaluation Patient Details Name: Leeza Heiner MRN: [id number] DOB: [birth year] Today's Date: 05/06/2014    History of Present Illness Pt adm 05/03/14 for repair of AAA. 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 PMH- HTN   Clinical Impression   Pt admitted with above.  Pt is moving very well (supevision-min guard level) but requires assist to complete LB ADLs.  Pt will benefit from acute OT services to address below problem list in order to maximize safety and independence with ADLs.    Follow Up Recommendations  No OT follow up;Supervision - Intermittent    Equipment Recommendations  3 in 1 bedside comode    Recommendations for Other Services       Precautions / Restrictions Precautions Precautions: None      Mobility Bed Mobility               General bed mobility comments: not assessed- pt up in chair  Transfers Overall transfer level: Needs assistance Equipment used: Rolling walker (2 wheeled) Transfers: Sit to/from Stand Sit to Stand: Min guard         General transfer comment: Incr time to complete transfer due to pain.    Balance                                            ADL Overall ADL's : Needs assistance/impaired     Grooming: Wash/dry hands;Wash/dry face;Supervision/safety;Standing   Upper Body Bathing: Set up;Sitting   Lower Body Bathing: Minimal assistance;Sit to/from stand   Upper Body Dressing : Set up;Sitting   Lower Body Dressing: Minimal assistance;Sit to/from stand   Toilet Transfer: Ambulation;Min guard   Toileting- Water quality scientist and Hygiene: Sit to/from stand;Min guard       Functional mobility during ADLs: Supervision/safety;Rolling walker General ADL Comments: Pt performed front/back peri care at min guard level following foley catheter removal by NT.  Pt able to don gown for ambulation in hallway.  Minimal cueing for deep  breathing techniques during mobility and ADLs.  Pt is unable to reach her feet (unable to cross ankles over knees) secondary to abdominal pain.       Vision                     Perception     Praxis      Pertinent Vitals/Pain C/o 7/10 pain in abdomen following ambulation in hall. RN made aware.     Hand Dominance     Extremity/Trunk Assessment Upper Extremity Assessment Upper Extremity Assessment: Overall WFL for tasks assessed           Communication Communication Communication: No difficulties   Cognition Arousal/Alertness: Awake/alert Behavior During Therapy: WFL for tasks assessed/performed Overall Cognitive Status: Within Functional Limits for tasks assessed                     General Comments       Exercises       Shoulder Instructions      Home Living Family/patient expects to be discharged to:: Private residence Living Arrangements: Children;Other relatives Available Help at Discharge: Family;Available PRN/intermittently Type of Home: House Home Access: Stairs to enter [employer] of Steps: 5 Entrance Stairs-Rails: None Home Layout: One level     Bathroom Shower/Tub: Occupational psychologist:  Standard     Home Equipment: Shower seat          Prior Functioning/Environment Level of Independence: Independent        Comments: Works as Pharmacologist Diagnosis: Generalized weakness;Acute pain   OT Problem List: Decreased strength;Decreased activity tolerance;Impaired balance (sitting and/or standing);Decreased knowledge of use of DME or AE;Pain   OT Treatment/Interventions: Self-care/ADL training;DME and/or AE instruction;Therapeutic activities;Patient/family education;Balance training    OT Goals(Current goals can be found in the care plan section) Acute Rehab OT Goals Patient Stated Goal: to get better OT Goal Formulation: With patient Time For Goal Achievement: 05/20/14 Potential to Achieve  Goals: Good  OT Frequency: Min 2X/week   Barriers to D/C:            Co-evaluation              End of Session Equipment Utilized During Treatment: Gait belt;Rolling walker Nurse Communication: Mobility status;Patient requests pain meds  Activity Tolerance: Patient tolerated treatment well Patient left: in chair;with call bell/phone within reach   Time: 1115-1145 OT Time Calculation (min): 30 min Charges:  OT General Charges $OT Visit: 1 Procedure OT Evaluation $Initial OT Evaluation Tier I: 1 Procedure OT Treatments $Self Care/Home Management : 8-22 mins $Therapeutic Activity: 8-22 mins G-Codes:    Luther Bradley 2014-06-03, 12:05 PM  06/03/2014 Luther Bradley OTR/L Pager [phone] Office [phone]

## 2014-05-07 LAB — CBC
HCT: 23.9 % — ABNORMAL LOW (ref 36.0–46.0)
HEMOGLOBIN: 8 g/dL — AB (ref 12.0–15.0)
MCH: 28.2 pg (ref 26.0–34.0)
MCHC: 33.5 g/dL (ref 30.0–36.0)
MCV: 84.2 fL (ref 78.0–100.0)
Platelets: 299 10*3/uL (ref 150–400)
RBC: 2.84 MIL/uL — AB (ref 3.87–5.11)
RDW: 15.7 % — ABNORMAL HIGH (ref 11.5–15.5)
WBC: 9.9 10*3/uL (ref 4.0–10.5)

## 2014-05-07 MED ORDER — OXYCODONE HCL 5 MG PO TABS
5.0000 mg | ORAL_TABLET | ORAL | Status: DC | PRN
  Administered 2014-05-07 – 2014-05-09 (×10): 5 mg via ORAL
  Filled 2014-05-07 (×10): qty 1

## 2014-05-07 MED ORDER — PANTOPRAZOLE SODIUM 40 MG PO TBEC
40.0000 mg | DELAYED_RELEASE_TABLET | Freq: Every day | ORAL | Status: DC
  Administered 2014-05-07 – 2014-05-09 (×3): 40 mg via ORAL
  Filled 2014-05-07 (×2): qty 1

## 2014-05-07 NOTE — Progress Notes (Addendum)
  [facility] AAA Progress Note  05/07/2014 8:31 AM 4 Days Post-Op  Subjective:  Having increased pain this am-was up a lot last night and staff got busy and she was behind on her pain med.  Denies N/V.  On clear liquids and tolerating  Tm 99.1 now afebrile HR 70's-80's regular 782-423'N systolic 361% [id number]  Filed Vitals:   05/07/14 0559  BP: 128/55  Pulse: 75  Temp: 98.5 F (36.9 C)  Resp: 17    Physical Exam: Cardiac:  Regular Lungs:  Non labored Abdomen:  Soft, NT, decreased BS; +small BM x 2 Incisions:  Clean with staples in tact-less drainage today Extremities:  2+ DP bilaterally  CBC    Component Value Date/Time   WBC 10.7* 05/06/2014 0331   RBC 2.98* 05/06/2014 0331   HGB 8.4* 05/06/2014 0331   HCT 25.5* 05/06/2014 0331   PLT 212 05/06/2014 0331   MCV 85.6 05/06/2014 0331   MCH 28.2 05/06/2014 0331   MCHC 32.9 05/06/2014 0331   RDW 16.2* 05/06/2014 0331   LYMPHSABS 3.2 04/17/2014 1021   MONOABS 0.5 04/17/2014 1021   EOSABS 0.2 04/17/2014 1021   BASOSABS 0.1 04/17/2014 1021    BMET    Component Value Date/Time   NA 136* 05/06/2014 0331   K 4.3 05/06/2014 0331   CL 101 05/06/2014 0331   CO2 24 05/06/2014 0331   GLUCOSE 108* 05/06/2014 0331   BUN 8 05/06/2014 0331   CREATININE 0.95 05/06/2014 0331   CALCIUM 8.1* 05/06/2014 0331   GFRNONAA 67* 05/06/2014 0331   GFRAA 78* 05/06/2014 0331    INR    Component Value Date/Time   INR 1.20 05/03/2014 1301     Intake/Output Summary (Last 24 hours) at 05/07/14 0831 Last data filed at 05/07/14 0800  Gross per 24 hour  Intake    840 ml  Output   2750 ml  Net  -1910 ml     Assessment/Plan:  53 y.o. female is s/p  Resection and grafting AAA with insertion of aorto-bi-common iliac graft 4 Days Post-Op  -pt doing well this am and tolerating clear liquid diet x 3 trays, but minimal amounts -+ small BM x 2 and no N/V--will advance slowly-continue full liquids today -increased pain this morning with  increased ambulation-behind on pain med schedule-will start po OxyIR today -labs pending this morning -continue increasing ambulation -possibly home in next couple of days   Leontine Locket, PA-C [facility] [phone] 05/07/2014 8:31 AM  Agree with above assessment Abdomen relatively soft incision healing nicely 2+ dorsalis pedis pulses palpable bilaterally  Patient has had a small bowel movement and has no nausea Tolerating clear liquids fairly well We'll advance to full liquids today and increase ambulation  Hopefully soft diet tomorrow and home on Tuesday

## 2014-05-08 MED ORDER — DOCUSATE SODIUM 100 MG PO CAPS
100.0000 mg | ORAL_CAPSULE | Freq: Two times a day (BID) | ORAL | Status: DC
  Administered 2014-05-08 – 2014-05-09 (×3): 100 mg via ORAL
  Filled 2014-05-08 (×4): qty 1

## 2014-05-08 MED ORDER — TRAMADOL HCL 50 MG PO TABS
50.0000 mg | ORAL_TABLET | Freq: Four times a day (QID) | ORAL | Status: DC | PRN
  Administered 2014-05-08 – 2014-05-09 (×3): 50 mg via ORAL
  Filled 2014-05-08 (×4): qty 1

## 2014-05-08 NOTE — Progress Notes (Signed)
Occupational Therapy Treatment Patient Details Name: Julie Holder MRN: [id number] DOB: [birth year] Today's Date: 05/08/2014    History of present illness Pt adm 05/03/14 for repair of AAA. 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 PMH- HTN   OT comments  Pt. Is progressing with therapy and is motivated to meet OT goals and return home. Pt. Was ed. On use of hip kit and was able to return demo with cues. Pt. States she will purchase kit and want a BSC for toileting. Pt. Will continued to be followed as appropriate.   Follow Up Recommendations  No OT follow up;Supervision - Intermittent    Equipment Recommendations  3 in 1 bedside comode    Recommendations for Other Services      Precautions / Restrictions Precautions Precautions: None       Mobility Bed Mobility                  Transfers                      Balance                                   ADL               Lower Body Bathing: Supervison/ safety;Sit to/from stand;With adaptive equipment       Lower Body Dressing: Supervision/safety;With adaptive equipment;Sit to/from stand                 General ADL Comments: Pt. states she has been AMB to bathroom without A and defer to perform secondary to meal just arriving.       Vision                     Perception     Praxis      Cognition   Behavior During Therapy: [facility] for tasks assessed/performed Overall Cognitive Status: Within Functional Limits for tasks assessed                       Extremity/Trunk Assessment               Exercises     Shoulder Instructions       General Comments      Pertinent Vitals/ Pain       No c/o pain.  Home Living                                          Prior Functioning/Environment              Frequency Min 2X/week     Progress Toward Goals  OT Goals(current  goals can now be found in the care plan section)  Progress towards OT goals: Progressing toward goals     Plan Discharge plan remains appropriate    Co-evaluation                 End of Session     Activity Tolerance Patient tolerated treatment well   Patient Left in chair;with call bell/phone within reach   Nurse Communication          Time: 0174-9449 OT Time Calculation (min): 38 min  Charges: OT General  Charges $OT Visit: 1 Procedure OT Treatments $Self Care/Home Management : 38-52 mins  Audry Pili 05/08/2014, 9:30 AM

## 2014-05-08 NOTE — Progress Notes (Addendum)
  [facility] AAA Progress Note  05/08/2014 7:20 AM 5 Days Post-Op  Subjective:  C/o aching in low back.  Pain is controlled with po meds as long as she gets it on time  Tm 99.2  HR 70's-80's regular 885'O-277'A systolic 12% RA  Filed Vitals:   05/08/14 0416  BP: 105/54  Pulse: 83  Temp: 99 F (37.2 C)  Resp: 18    Physical Exam: Cardiac:  regular Lungs:  Non labored Abdomen:  Soft, decreased BS; no BM Incisions:  Less drainage from distal incision-still serous, otherwise, c/d/i with staples in tact.   CBC    Component Value Date/Time   WBC 9.9 05/07/2014 0850   RBC 2.84* 05/07/2014 0850   HGB 8.0* 05/07/2014 0850   HCT 23.9* 05/07/2014 0850   PLT 299 05/07/2014 0850   MCV 84.2 05/07/2014 0850   MCH 28.2 05/07/2014 0850   MCHC 33.5 05/07/2014 0850   RDW 15.7* 05/07/2014 0850   LYMPHSABS 3.2 04/17/2014 1021   MONOABS 0.5 04/17/2014 1021   EOSABS 0.2 04/17/2014 1021   BASOSABS 0.1 04/17/2014 1021    BMET    Component Value Date/Time   NA 136* 05/06/2014 0331   K 4.3 05/06/2014 0331   CL 101 05/06/2014 0331   CO2 24 05/06/2014 0331   GLUCOSE 108* 05/06/2014 0331   BUN 8 05/06/2014 0331   CREATININE 0.95 05/06/2014 0331   CALCIUM 8.1* 05/06/2014 0331   GFRNONAA 67* 05/06/2014 0331   GFRAA 78* 05/06/2014 0331    INR    Component Value Date/Time   INR 1.20 05/03/2014 1301     Intake/Output Summary (Last 24 hours) at 05/08/14 0720 Last data filed at 05/08/14 0417  Gross per 24 hour  Intake      0 ml  Output   1150 ml  Net  -1150 ml     Assessment/Plan:  53 y.o. female is s/p  Resection and grafting AAA with insertion of aorto-bi-common iliac graft 5 Days Post-Op  -doing well this am-pain better controlled this am, but if she does not have pain meds on time, her pain is worse. -hgb down slightly from previous day from 8.4 to 8.0 - pt is tolerating -will add stool softener to regimen as she has not had another BM -advance diet today -she is  mobilizing well and walking in halls   Leontine Locket, [place] [facility] [phone] 05/08/2014 7:20 AM  Agree with above abd---soft-2+dp pulses Advance diet and DC home in next few days

## 2014-05-08 NOTE — Progress Notes (Signed)
Physical Therapy Treatment and D/C Patient Details Name: Julie Holder MRN: [id number] DOB: 07-05-1961 Today's Date: 2014-06-04    History of Present Illness Pt adm 05/03/14 for repair of AAA. 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 PMH- HTN    PT Comments    Pt admitted with above. Pt currently without functional limitations and is functioning independently.  Pt will no longer need skilled PT as goals are met.  Will d/c PT.    Follow Up Recommendations  No PT follow up;Supervision - Intermittent     Equipment Recommendations  3in1 (PT)    Recommendations for Other Services       Precautions / Restrictions Precautions Precautions: None Restrictions Weight Bearing Restrictions: No    Mobility  Bed Mobility                  Transfers Overall transfer level: Independent   Transfers: Sit to/from Stand Sit to Stand: Independent         General transfer comment: No cues or assist needed.  Ambulation/Gait Ambulation/Gait assistance: Independent Ambulation Distance (Feet): 600 Feet Assistive device: None Gait Pattern/deviations: Step-to pattern;Decreased stride length;Wide base of support   Gait velocity interpretation: at or above normal speed for age/gender General Gait Details: No device needed and can accept minimal challenges to balance.     Stairs Stairs: Yes Stairs assistance: Independent Stair Management: No rails;Step to pattern;Forwards Number of Stairs: 3 General stair comments: No difficulties on stairs  Wheelchair Mobility    Modified Rankin (Stroke Patients Only)       Balance           Standing balance support: No upper extremity supported;During functional activity Standing balance-Leahy Scale: Good                      Cognition Arousal/Alertness: Awake/alert Behavior During Therapy: WFL for tasks assessed/performed Overall Cognitive Status: Within Functional  Limits for tasks assessed                      Exercises General Exercises - Lower Extremity Ankle Circles/Pumps: AROM;Both;10 reps;Seated Long Arc Quad: AROM;Both;10 reps;Seated    General Comments        Pertinent Vitals/Pain VSS, Incisional pain not rated by pt    Home Living                      Prior Function            PT Goals (current goals can now be found in the care plan section) Progress towards PT goals: Goals met/education completed, patient discharged from PT    Frequency       PT Plan Current plan remains appropriate    Co-evaluation             End of Session Equipment Utilized During Treatment: Other (comment) (No gait belt due to incision) Activity Tolerance: Patient tolerated treatment well Patient left: in chair;with call bell/phone within reach     Time: 1025-1050 PT Time Calculation (min): 25 min  Charges:  $Gait Training: 8-22 mins $Therapeutic Exercise: 8-22 mins                    G Codes:      Christianne Dolin 06/04/14, 10:36 AM [facility] [phone] (pager)

## 2014-05-09 ENCOUNTER — Telehealth: Payer: Self-pay | Admitting: Vascular Surgery

## 2014-05-09 MED ORDER — TRAMADOL HCL 50 MG PO TABS: 50.0000 mg | ORAL_TABLET | Freq: Four times a day (QID) | ORAL | Status: DC | PRN

## 2014-05-09 MED ORDER — OXYCODONE HCL 5 MG PO TABS: 5.0000 mg | ORAL_TABLET | ORAL | Status: DC | PRN

## 2014-05-09 NOTE — Care Management Note (Signed)
    Page 1 of 1   05/09/2014     4:07:33 PM CARE MANAGEMENT NOTE 05/09/2014  Patient:  Julie Holder, Julie Holder   Account Number:  [id number]  Date Initiated:  05/03/2014  Documentation initiated by:  Luz Lex  Subjective/Objective Assessment:   Admitted post op AAA repair.     Action/Plan:   Anticipated DC Date:  05/09/2014   Anticipated DC Plan:  [place]  CM consult      Choice offered to / List presented to:     DME arranged  [place]      DME agency  [place].        Status of service:  Completed, signed off Medicare Important Message given?   (If response is "NO", the following Medicare IM given date fields will be blank) Date Medicare IM given:   Date Additional Medicare IM given:    Discharge Disposition:  HOME/SELF CARE  Per UR Regulation:  Reviewed for med. necessity/level of care/duration of stay  If discussed at [place] of Stay Meetings, dates discussed:   05/09/2014    Comments:  Contact:  Boghosian,[place] Brother [phone]                 Criswell,Marquis Son [phone]                 Cogswell,Shirley Sister     [phone]  05/09/14 Ellan Lambert, RN, BSN [phone] PT recommending 3 in 1 for home, and pt wishes to obtain. Referral to [facility] for dme needs.  05-04-14 3:10pm Luz Lex, [place] [phone] Lives with brother and grandaughter.  Grandaughter 6. States  brother will be with her post discharge for assistance.

## 2014-05-09 NOTE — Telephone Encounter (Addendum)
Message copied by Gena Fray on Tue May 09, 2014  2:15 PM ------      Message from: Mena Goes      Created: Tue May 09, 2014 10:34 AM      Regarding: Schedule                   ----- Message -----         From: Gabriel Earing, PA-C         Sent: 05/09/2014   8:17 AM           To: [facility] Charge Pool            S/p aortobi-iliac bypass grafting.  F/u in 2 weeks.  Will need staple removal at that time.            Thanks,      Samantha ------  05/09/14: lm for pt to call back for appt, dpm

## 2014-05-09 NOTE — Progress Notes (Signed)
Pt discharged home per written orders. Pt removed from telemetry. TMU notified. All d/c orders/instructions/follow up care/medications/follow up appts/wound care/activity restrictions discussed as well as signs/symptoms that would require a phone call/visit to the doctor. Time allowed for questions and concerns. Pt states she has none at this time. Pt sent home with a bedside commode per pt request. Pt's brother to transport home. Soyla Dryer, RN and Financial controller assisted with transporting patient (wheel chair) and her belongings (cart) to the old main entrance.  Soyla Dryer, RN

## 2014-05-09 NOTE — Progress Notes (Signed)
  [facility] AAA Progress Note  05/09/2014 8:11 AM 6 Days Post-Op  Subjective:  Still feels achy all over  Afebrile VSS 95% RA  Filed Vitals:   05/09/14 0419  BP: 133/60  Pulse: 75  Temp: 98.3 F (36.8 C)  Resp: 18    Physical Exam: Cardiac:  regular Lungs:  Non labored Abdomen:  Soft NT, +BS, +flatus, -BM Incisions:  C/d/i-scant serous drainage today on bandage-improved from yesterday Extremities:  Bilateral feet are warm  CBC    Component Value Date/Time   WBC 9.9 05/07/2014 0850   RBC 2.84* 05/07/2014 0850   HGB 8.0* 05/07/2014 0850   HCT 23.9* 05/07/2014 0850   PLT 299 05/07/2014 0850   MCV 84.2 05/07/2014 0850   MCH 28.2 05/07/2014 0850   MCHC 33.5 05/07/2014 0850   RDW 15.7* 05/07/2014 0850   LYMPHSABS 3.2 04/17/2014 1021   MONOABS 0.5 04/17/2014 1021   EOSABS 0.2 04/17/2014 1021   BASOSABS 0.1 04/17/2014 1021    BMET    Component Value Date/Time   NA 136* 05/06/2014 0331   K 4.3 05/06/2014 0331   CL 101 05/06/2014 0331   CO2 24 05/06/2014 0331   GLUCOSE 108* 05/06/2014 0331   BUN 8 05/06/2014 0331   CREATININE 0.95 05/06/2014 0331   CALCIUM 8.1* 05/06/2014 0331   GFRNONAA 67* 05/06/2014 0331   GFRAA 78* 05/06/2014 0331    INR    Component Value Date/Time   INR 1.20 05/03/2014 1301     Intake/Output Summary (Last 24 hours) at 05/09/14 0811 Last data filed at 05/09/14 0805  Gross per 24 hour  Intake   1200 ml  Output   5900 ml  Net  -4700 ml     Assessment/Plan:  53 y.o. female is s/p  Resection and grafting AAA with insertion of aorto-bi-common iliac graft 6 Days Post-Op  -pt doing well today-she is tolerating her diet.  Still with no BM, but is passing flatus. -will discharge home today on OxyIR with Tramadol for breakthrough pain.  Will also send with colace daily. -f/u with Dr. Kellie Simmering in 2 weeks.   Leontine Locket, PA-C [facility] [phone] 05/09/2014 8:11 AM

## 2014-05-09 NOTE — Progress Notes (Signed)
Came to visit patient at bedside on behalf Link to Wellness program for [employer] employees/dependents with [facility] insurance. Patient was active Link to Wellness in the past for HTN and HLD. She is familiar with the program and reports she may be interested in it again but will call the office for an appointment once she is ready for follow up. Agreeable to post hospital discharge call. Confirmed best contact information for her. Left Link to Wellness packet and contact information at bedside. Marthenia Rolling, MSN-RN,BSN- [facility] [phone]

## 2014-05-09 NOTE — Discharge Summary (Signed)
[facility] AAA Discharge Summary  Julie Holder 1961-07-05 53 y.o. female  [id number]  Admission Date: 05/03/2014  Discharge Date: 05/09/14  Physician: Mal Misty, MD  Admission Diagnosis: Abdominal aneurysm without mention of rupture   HPI:   This is a 53 y.o. female returns today for further discussion about her large abdominal aortic aneurysm. She had a CT angiogram performed on May 8 which I have reviewed the computer. Aneurysm measure 6.3 cm in maximum diameter and extends up to the renal arteries with a very short-less than 5 mm-neck below the renal arteries. 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 Patient denies any new symptoms of abdominal or back pain. She has no history of coronary artery disease, CVA, TIAs, diabetes mellitus. She stopped smoking 5 years ago and denies COPD  Hospital Course:  The patient was admitted to the hospital and taken to the operating room on 05/03/2014 and underwent: Resection and grafting AAA with insertion of aorto-bi-common iliac graft   The pt tolerated the procedure well and was transported to the PACU in good condition.   On POD 1, her pain was not well controlled with morphine and was started on Dilaudid, which she got relief from.  She did have acute surgical blood loss anemia and received 1 unit of PRBC's intraoperatively as well as cell saver.  Her NGT was removed and POD 1 and she did well with this.    On POD 2, she was transferred to the telemetry floor.  She was up 7lbs from pre-op and gently diuresed with lasix with good results.  She was started on clear liquids.  Her diet was advanced as tolerated without difficulty.   On POD 3, she did have serous drainage from her distal incision as well as at the umbilicus, but this has significantly improved over her hospital stay.  She did have pain control issues.  She had been on Tramadol  in the past and was placed on this for break through pain with her OxyIR and this seemed to relieve her pain.  The remainder of the hospital course consisted of increasing mobilization and increasing intake of solids without difficulty.  CBC    Component Value Date/Time   WBC 9.9 05/07/2014 0850   RBC 2.84* 05/07/2014 0850   HGB 8.0* 05/07/2014 0850   HCT 23.9* 05/07/2014 0850   PLT 299 05/07/2014 0850   MCV 84.2 05/07/2014 0850   MCH 28.2 05/07/2014 0850   MCHC 33.5 05/07/2014 0850   RDW 15.7* 05/07/2014 0850   LYMPHSABS 3.2 04/17/2014 1021   MONOABS 0.5 04/17/2014 1021   EOSABS 0.2 04/17/2014 1021   BASOSABS 0.1 04/17/2014 1021    BMET    Component Value Date/Time   NA 136* 05/06/2014 0331   K 4.3 05/06/2014 0331   CL 101 05/06/2014 0331   CO2 24 05/06/2014 0331   GLUCOSE 108* 05/06/2014 0331   BUN 8 05/06/2014 0331   CREATININE 0.95 05/06/2014 0331   CALCIUM 8.1* 05/06/2014 0331   GFRNONAA 67* 05/06/2014 0331   GFRAA 78* 05/06/2014 0331     Discharge Instructions:   The patient is discharged to home with extensive instructions on wound care and progressive ambulation.  They are instructed not to drive or perform any heavy lifting until returning to see the physician in his office.    Discharge Diagnosis:  Abdominal aneurysm without mention of rupture  Secondary Diagnosis:  Patient Active Problem List   Diagnosis Date Noted  . AAA (abdominal aortic aneurysm) 05/03/2014  . Abdominal aneurysm without mention of rupture 04/25/2014  . Severe obesity (BMI >= 40) 04/17/2014  . Hyperlipidemia 10/22/2011  . Elevated blood pressure 10/22/2011  . Hidradenitis suppurativa 10/22/2011   Past Medical History  Diagnosis Date  . Chicken pox   . Hypertension   . Hyperlipidemia   . Hidradenitis suppurativa   . Anemia        Medication List    ASK your doctor about these medications       cholecalciferol 400 UNITS Tabs tablet  Commonly known as:  VITAMIN D  Take 400 Units by mouth  daily.     CoQ10 100 MG Caps  Take 100 mg by mouth 2 (two) times daily.     docusate sodium 100 MG capsule  Commonly known as:  COLACE  Take 100 mg by mouth 2 (two) times daily.     Fish Oil 1200 MG Caps  Take 1,200 mg by mouth 2 (two) times daily.     lisinopril-hydrochlorothiazide 10-12.5 MG per tablet  Commonly known as:  PRINZIDE,ZESTORETIC  Take 1 tablet by mouth daily.     mupirocin ointment 2 %  Commonly known as:  BACTROBAN  Apply 1 application topically 2 (two) times daily. Use daily to affected areas as needed per dermatologist     pravastatin 40 MG tablet  Commonly known as:  PRAVACHOL  Take 20 mg by mouth 2 (two) times daily.     Red Yeast Rice 600 MG Caps  Take 600 mg by mouth 2 (two) times daily.     traMADol 50 MG tablet  Commonly known as:  ULTRAM  Take 50 mg by mouth every 6 (six) hours as needed for pain. Per dermatologist        roxicodone #30 No Refill Tramadol #30 No Refill  Disposition: home  Patient's condition: is Good  Follow up: 1. Dr. Kellie Simmering in 2 weeks   Leontine Locket, PA-C [facility] [phone] 05/09/2014  8:15 AM   - For VQI Registry use ---   Post-op:  Time to Extubation: [ x] In OR, [ ]  < 12 hrs, [ ]  12-24 hrs, [ ]  >=24 hrs Vasopressors Req. Post-op: No ICU Stay: 2 days Transfusion: Yes  If yes, 1 units given MI: No, [ ]  Troponin only, [ ]  EKG or Clinical New Arrhythmia: No  Complications: CHF: No Resp failure: No, [ ]  Pneumonia, [ ]  Ventilator Chg in renal function: No, [ ]  Inc. Cr > 0.5, [ ]  Temp. Dialysis, [ ]  Permanent dialysis Leg ischemia: No, no Surgery needed, [ ]  Yes, Surgery needed, [ ]  Amputation Bowel ischemia: No, [ ]  Medical Rx, [ ]  Surgical Rx Wound complication: No, [ ]  Superficial separation/infection, [ ]  Return to OR Return to OR: No  Return to OR for bleeding: No Stroke: No, [ ]  Minor, [ ]  Major  Discharge medications: Statin use:  Yes If No: [ ]  For Medical reasons, [ ]   Non-compliant ASA use:  No  If No: [ ]  For Medical reasons, [ ]  Non-compliant Plavix use:  No If No: [ ]  For Medical reasons, [ ]  Non-compliant Beta blocker use:  No If No: [ ]  For Medical reasons, [ ]  Non-compliant

## 2014-05-22 ENCOUNTER — Encounter: Payer: Self-pay | Admitting: Vascular Surgery

## 2014-05-23 ENCOUNTER — Ambulatory Visit (INDEPENDENT_AMBULATORY_CARE_PROVIDER_SITE_OTHER): Payer: Self-pay | Admitting: Vascular Surgery

## 2014-05-23 ENCOUNTER — Encounter: Payer: Self-pay | Admitting: Vascular Surgery

## 2014-05-23 VITALS — BP 126/72 | HR 91 | Resp 18 | Ht 65.0 in | Wt 253.0 lb

## 2014-05-23 DIAGNOSIS — I714 Abdominal aortic aneurysm, without rupture, unspecified: Secondary | ICD-10-CM

## 2014-05-24 NOTE — Progress Notes (Signed)
Subjective:     Patient ID: Julie Holder, female   DOB: 08-03-1961, 53 y.o.   MRN: [id number]  HPI this 53 year old female returns for initial followup visit regarding open resection and grafting of abdominal aortic aneurysm 3 weeks ago. She states her appetite is returning toward normal. She's having normal bowel movements. She is beginning to end a light more. She has had some minimal drainage from the periumbilical aspect of the wound.   Review of Systems     Objective:   Physical Exam BP 126/72  Pulse 91  Resp 18  Ht 5\' 5"  (1.651 m)  Wt 253 lb (114.76 kg)  BMI 42.10 kg/m2  LMP 05/31/2011  General well-developed well-nourished obese female in no apparent stress alert and oriented x3 Lungs no rhonchi or wheezing Abdominal wound has healed satisfactorily skin staples are in place there is no evidence of ventral hernia and no drainage. Skin staples were removed. A small area of skin overlap was present in the periumbilical area and this was Steri-Stripped after applying benzoin. The patient does have palpable femoral pulses are 3+ bilaterally and no distal edema.      Assessment:     Doing well 3 weeks post resection and grafting of abdominal aortic aneurysm in obese female    Plan:     Increase ambulation as tolerated Return to see me in 2 months with ABIs at that time

## 2014-06-02 ENCOUNTER — Encounter: Payer: Self-pay | Admitting: Family Medicine

## 2014-06-02 ENCOUNTER — Ambulatory Visit (INDEPENDENT_AMBULATORY_CARE_PROVIDER_SITE_OTHER): Payer: 59 | Admitting: Family Medicine

## 2014-06-02 VITALS — BP 126/72 | HR 80 | Temp 97.9°F | Wt 253.0 lb

## 2014-06-02 DIAGNOSIS — R5383 Other fatigue: Secondary | ICD-10-CM

## 2014-06-02 DIAGNOSIS — R5381 Other malaise: Secondary | ICD-10-CM

## 2014-06-02 DIAGNOSIS — K5900 Constipation, unspecified: Secondary | ICD-10-CM

## 2014-06-02 DIAGNOSIS — D649 Anemia, unspecified: Secondary | ICD-10-CM

## 2014-06-02 LAB — CBC WITH DIFFERENTIAL/PLATELET
Basophils Absolute: 0 10*3/uL (ref 0.0–0.1)
Basophils Relative: 0.3 % (ref 0.0–3.0)
EOS ABS: 0.2 10*3/uL (ref 0.0–0.7)
EOS PCT: 1.8 % (ref 0.0–5.0)
HEMATOCRIT: 31 % — AB (ref 36.0–46.0)
Hemoglobin: 10.3 g/dL — ABNORMAL LOW (ref 12.0–15.0)
LYMPHS ABS: 2.8 10*3/uL (ref 0.7–4.0)
Lymphocytes Relative: 30.5 % (ref 12.0–46.0)
MCHC: 33.2 g/dL (ref 30.0–36.0)
MCV: 83.8 fl (ref 78.0–100.0)
Monocytes Absolute: 0.7 10*3/uL (ref 0.1–1.0)
Monocytes Relative: 8.2 % (ref 3.0–12.0)
Neutro Abs: 5.4 10*3/uL (ref 1.4–7.7)
Neutrophils Relative %: 59.2 % (ref 43.0–77.0)
Platelets: 371 10*3/uL (ref 150.0–400.0)
RBC: 3.7 Mil/uL — ABNORMAL LOW (ref 3.87–5.11)
RDW: 16.3 % — ABNORMAL HIGH (ref 11.5–15.5)
WBC: 9.1 10*3/uL (ref 4.0–10.5)

## 2014-06-02 NOTE — Patient Instructions (Signed)
° °  Iron-Rich Diet ° °An iron-rich diet contains foods that are good sources of iron. Iron is an important mineral that helps your body produce hemoglobin. Hemoglobin is a protein in red blood cells that carries oxygen to the body's tissues. Sometimes, the iron level in your blood can be low. This may be caused by: °· A lack of iron in your diet. °· Blood loss. °· Times of growth, such as during pregnancy or during a child's growth and development. °Low levels of iron can cause a decrease in the number of red blood cells. This can result in iron deficiency anemia. Iron deficiency anemia symptoms include: °· Tiredness. °· Weakness. °· Irritability. °· Increased chance of infection. °Here are some recommendations for daily iron intake: °· Males older than 54 years of age need 8 mg of iron per day. °· Women ages 19 to 50 need 18 mg of iron per day. °· Pregnant women need 27 mg of iron per day, and women who are over 19 years of age and breastfeeding need 9 mg of iron per day. °· Women over the age of 50 need 8 mg of iron per day. °SOURCES OF IRON °There are 2 types of iron that are found in food: heme iron and nonheme iron. Heme iron is absorbed by the body better than nonheme iron. Heme iron is found in meat, poultry, and fish. Nonheme iron is found in grains, beans, and vegetables. °Heme Iron Sources °Food / Iron (mg) °· Chicken liver, 3 oz (85 g)/ 10 mg °· Beef liver, 3 oz (85 g)/ 5.5 mg °· Oysters, 3 oz (85 g)/ 8 mg °· Beef, 3 oz (85 g)/ 2 to 3 mg °· Shrimp, 3 oz (85 g)/ 2.8 mg °· Turkey, 3 oz (85 g)/ 2 mg °· Chicken, 3 oz (85 g) / 1 mg °· Fish (tuna, halibut), 3 oz (85 g)/ 1 mg °· Pork, 3 oz (85 g)/ 0.9 mg °Nonheme Iron Sources °Food / Iron (mg) °· Ready-to-eat breakfast cereal, iron-fortified / 3.9 to 7 mg °· Tofu, ½ cup / 3.4 mg °· Kidney beans, ½ cup / 2.6 mg °· Baked potato with skin / 2.7 mg °· Asparagus, ½ cup / 2.2 mg °· Avocado / 2 mg °· Dried peaches, ½ cup / 1.6 mg °· Raisins, ½ cup / 1.5 mg °· Soy milk,  1 cup / 1.5 mg °· Whole-wheat bread, 1 slice / 1.2 mg °· Spinach, 1 cup / 0.8 mg °· Broccoli, ½ cup / 0.6 mg °IRON ABSORPTION °Certain foods can decrease the body's absorption of iron. Try to avoid these foods and beverages while eating meals with iron-containing foods: °· Coffee. °· Tea. °· Fiber. °· Soy. °Foods containing vitamin C can help increase the amount of iron your body absorbs from iron sources, especially from nonheme sources. Eat foods with vitamin C along with iron-containing foods to increase your iron absorption. Foods that are high in vitamin C include many fruits and vegetables. Some good sources are: °· Fresh orange juice. °· Oranges. °· Strawberries. °· Mangoes. °· Grapefruit. °· Red bell peppers. °· Green bell peppers. °· Broccoli. °· Potatoes with skin. °· Tomato juice. °Document Released: 07/22/2005 Document Revised: 03/01/2012 Document Reviewed: 05/29/2011 °ExitCare® Patient Information ©2014 [employer]. ° °

## 2014-06-02 NOTE — Progress Notes (Signed)
Subjective:    Patient ID: Julie Holder, female    DOB: 28-Dec-1960, 53 y.o.   MRN: [id number]  HPI Patient seen following recent abdominal aortic aneurysm repair. She complains of some general fatigue and also intermittent constipation.  She had some low back pain and went to see orthopedist and had MRI scan which showed incidental aneurysm 6.1 cm.  CT angiogram revealed 6.3 cm aneurysm. She underwent surgery in May and this was without complication. Her risk factors for aneurysm include history of hypertension and also previous smoking use.  She's had good appetite since her surgery and overall doing fairly well. She's lost 12 pounds. Discharge hemoglobin 8.0. She has some mild dizziness occasionally with standing. General fatigue. She is slowly working back up her activity levels. She had some constipation and is using some type of herbal supplement is helping. She is also left off oxycodone and this of course is helped as well. She is sleeping well  Past Medical History  Diagnosis Date  . Chicken pox   . Hypertension   . Hyperlipidemia   . Hidradenitis suppurativa   . Anemia    Past Surgical History  Procedure Laterality Date  . Hand surgery  1975  . Colonoscopy w/ biopsies and polypectomy    . Abdominal aortic aneurysm repair N/A 05/03/2014    Procedure: RESECTION AND GRAFTING ANEURYSM ABDOMINAL AORTIC WITH BILATERAL COMMON ILIAC GRAFTING USING 18X9 HEMASHIELD GRAFT;  Surgeon: Mal Misty, MD;  Location: [place];  Service: Vascular;  Laterality: N/A;    reports that she quit smoking about 5 years ago. Her smoking use included Cigarettes. She has a 9 pack-year smoking history. She has never used smokeless tobacco. She reports that she does not drink alcohol or use illicit drugs. family history includes Cancer in her brother and father; Diabetes in her brother and mother; Heart disease in her mother; Hyperlipidemia in her brother, father, mother, sister, and son; Hypertension in her  brother, father, mother, and sister; Kidney disease in her mother. There is no history of Colon cancer. Allergies  Allergen Reactions  . Amoxicillin Shortness Of Breath  . Fish Allergy Anaphylaxis, Itching and Rash    Shell fish  . Claravis [Isotretinoin]     Muscle spasms  . Penicillins     hives  . Statins Other (See Comments)    Muscle spasms  . Aspirin Other (See Comments)    unknown  . Tape Rash      Review of Systems  Constitutional: Positive for fatigue. Negative for fever and chills.  Respiratory: Negative for cough and shortness of breath.   Cardiovascular: Negative for chest pain.  Gastrointestinal: Negative for nausea, vomiting, abdominal pain, diarrhea and blood in stool.  Neurological: Negative for dizziness.       Objective:   Physical Exam  Constitutional: She is oriented to person, place, and time. She appears well-developed and well-nourished.  Cardiovascular: Normal rate and regular rhythm.   Pulmonary/Chest: Effort normal and breath sounds normal. No respiratory distress. She has no wheezes. She has no rales.  Abdominal: Soft. Bowel sounds are normal. She exhibits no distension and no mass. There is no tenderness. There is no rebound and no guarding.  Incision appears to be healing well. No signs of secondary infection  Musculoskeletal: She exhibits no edema.  Neurological: She is alert and oriented to person, place, and time. No cranial nerve deficit.  Psychiatric: She has a normal mood and affect. Her behavior is normal.  Assessment & Plan:  Patient presents with general fatigue following surgery with postoperative anemia as expected. Recheck CBC. Handout on iron rich diet.  Constipation. Conservative measures discussed to reduce. Her symptoms are already improving with discontinuation of opioid.

## 2014-06-02 NOTE — Progress Notes (Signed)
Pre visit review using our clinic review tool, if applicable. No additional management support is needed unless otherwise documented below in the visit note. 

## 2014-07-24 ENCOUNTER — Encounter: Payer: Self-pay | Admitting: Vascular Surgery

## 2014-07-25 ENCOUNTER — Encounter: Payer: Self-pay | Admitting: Vascular Surgery

## 2014-07-25 ENCOUNTER — Ambulatory Visit (INDEPENDENT_AMBULATORY_CARE_PROVIDER_SITE_OTHER): Payer: 59 | Admitting: Vascular Surgery

## 2014-07-25 VITALS — BP 123/57 | HR 85 | Resp 18 | Ht 65.0 in | Wt 255.0 lb

## 2014-07-25 DIAGNOSIS — Z48812 Encounter for surgical aftercare following surgery on the circulatory system: Secondary | ICD-10-CM

## 2014-07-25 DIAGNOSIS — I714 Abdominal aortic aneurysm, without rupture, unspecified: Secondary | ICD-10-CM

## 2014-07-25 NOTE — Addendum Note (Signed)
Addended by: Mena Goes on: 07/25/2014 01:43 PM   Modules accepted: Orders

## 2014-07-25 NOTE — Progress Notes (Signed)
Subjective:     Patient ID: Julie Holder, female   DOB: 09-01-1961, 53 y.o.   MRN: [id number]  HPI this 53 year old female returns for continued followup regarding her open resection and grafting of abdominal aortic aneurysm which performed by me 05/03/2014. She has a good appetite and is regaining her normal strength. She has not returned to work. She has no specific complaints.  Review of Systems     Objective:   Physical Exam BP 123/57  Pulse 85  Resp 18  Ht 5\' 5"  (1.651 m)  Wt 255 lb (115.667 kg)  BMI 42.43 kg/m2  LMP 05/31/2011 General obese female no apparent stress alert and oriented x3 Abdominal incision well healed with some keloid formation. No evidence of hernia. 2+ femoral and distal pulses bilaterally.      Assessment:     Doing well 3 months post resection and grafting of abdominal aortic aneurysm with aortobiiliac graft    Plan:     Okay for patient to return to work beginning 07/26/2014 Limitation of lifting no more than 50 pounds Patient return in 9 months to see nurse practitioner in repeat ABIs

## 2014-08-02 ENCOUNTER — Encounter: Payer: Self-pay | Admitting: Vascular Surgery

## 2014-08-23 ENCOUNTER — Ambulatory Visit: Payer: PRIVATE HEALTH INSURANCE | Attending: Sports Medicine

## 2014-08-23 DIAGNOSIS — M545 Low back pain, unspecified: Secondary | ICD-10-CM | POA: Diagnosis not present

## 2014-08-23 DIAGNOSIS — IMO0001 Reserved for inherently not codable concepts without codable children: Secondary | ICD-10-CM | POA: Insufficient documentation

## 2014-09-05 ENCOUNTER — Encounter: Payer: Self-pay | Admitting: Rehabilitation

## 2014-09-07 ENCOUNTER — Encounter: Payer: Self-pay | Admitting: Physical Therapy

## 2014-09-11 ENCOUNTER — Ambulatory Visit: Payer: PRIVATE HEALTH INSURANCE | Admitting: Physical Therapy

## 2014-09-11 DIAGNOSIS — IMO0001 Reserved for inherently not codable concepts without codable children: Secondary | ICD-10-CM | POA: Diagnosis not present

## 2014-09-12 ENCOUNTER — Encounter: Payer: Self-pay | Admitting: Rehabilitation

## 2014-09-14 ENCOUNTER — Encounter: Payer: Self-pay | Admitting: Physical Therapy

## 2014-09-14 ENCOUNTER — Ambulatory Visit: Payer: PRIVATE HEALTH INSURANCE | Admitting: Physical Therapy

## 2014-09-14 DIAGNOSIS — IMO0001 Reserved for inherently not codable concepts without codable children: Secondary | ICD-10-CM | POA: Diagnosis not present

## 2014-09-19 ENCOUNTER — Encounter: Payer: Self-pay | Admitting: Physical Therapy

## 2014-09-21 ENCOUNTER — Ambulatory Visit: Payer: 59 | Attending: Sports Medicine

## 2014-09-21 DIAGNOSIS — Z5189 Encounter for other specified aftercare: Secondary | ICD-10-CM | POA: Diagnosis not present

## 2014-09-25 ENCOUNTER — Ambulatory Visit: Payer: 59 | Admitting: Rehabilitation

## 2014-09-25 DIAGNOSIS — Z5189 Encounter for other specified aftercare: Secondary | ICD-10-CM | POA: Diagnosis not present

## 2014-09-28 ENCOUNTER — Encounter: Payer: Self-pay | Admitting: Physical Therapy

## 2014-10-16 ENCOUNTER — Other Ambulatory Visit: Payer: Self-pay

## 2014-10-16 DIAGNOSIS — Z1231 Encounter for screening mammogram for malignant neoplasm of breast: Secondary | ICD-10-CM

## 2014-10-17 ENCOUNTER — Encounter: Payer: Self-pay | Admitting: Rehabilitation

## 2014-10-31 ENCOUNTER — Ambulatory Visit
Admission: RE | Admit: 2014-10-31 | Discharge: 2014-10-31 | Disposition: A | Discharge status: Home / Self Care | Payer: 59 | Source: Ambulatory Visit

## 2014-10-31 DIAGNOSIS — Z1231 Encounter for screening mammogram for malignant neoplasm of breast: Secondary | ICD-10-CM

## 2014-11-02 ENCOUNTER — Other Ambulatory Visit: Payer: Self-pay | Admitting: Family Medicine

## 2014-11-07 ENCOUNTER — Other Ambulatory Visit: Payer: Self-pay | Admitting: Family Medicine

## 2014-11-07 DIAGNOSIS — R928 Other abnormal and inconclusive findings on diagnostic imaging of breast: Secondary | ICD-10-CM

## 2014-11-09 ENCOUNTER — Encounter: Payer: Self-pay | Admitting: Family

## 2014-11-10 ENCOUNTER — Encounter: Payer: Self-pay | Admitting: Family

## 2014-11-10 ENCOUNTER — Ambulatory Visit (INDEPENDENT_AMBULATORY_CARE_PROVIDER_SITE_OTHER): Payer: 59 | Admitting: Family

## 2014-11-10 VITALS — BP 105/64 | HR 57 | Temp 97.1°F | Resp 14 | Ht 65.0 in | Wt 241.0 lb

## 2014-11-10 DIAGNOSIS — Z48812 Encounter for surgical aftercare following surgery on the circulatory system: Secondary | ICD-10-CM | POA: Insufficient documentation

## 2014-11-10 DIAGNOSIS — T148XXA Other injury of unspecified body region, initial encounter: Secondary | ICD-10-CM | POA: Insufficient documentation

## 2014-11-10 DIAGNOSIS — R1030 Lower abdominal pain, unspecified: Secondary | ICD-10-CM | POA: Insufficient documentation

## 2014-11-10 DIAGNOSIS — L24A9 Irritant contact dermatitis due friction or contact with other specified body fluids: Secondary | ICD-10-CM

## 2014-11-10 NOTE — Patient Instructions (Signed)
Abdominal Aortic Aneurysm An aneurysm is a weakened or damaged part of an artery wall that bulges from the normal force of blood pumping through the body. An abdominal aortic aneurysm is an aneurysm that occurs in the lower part of the aorta, the main artery of the body.  The major concern with an abdominal aortic aneurysm is that it can enlarge and burst (rupture) or blood can flow between the layers of the wall of the aorta through a tear (aorticdissection). Both of these conditions can cause bleeding inside the body and can be life threatening unless diagnosed and treated promptly. CAUSES  The exact cause of an abdominal aortic aneurysm is unknown. Some contributing factors are:   A hardening of the arteries caused by the buildup of fat and other substances in the lining of a blood vessel (arteriosclerosis).  Inflammation of the walls of an artery (arteritis).   Connective tissue diseases, such as Marfan syndrome.   Abdominal trauma.   An infection, such as syphilis or staphylococcus, in the wall of the aorta (infectious aortitis) caused by bacteria. RISK FACTORS  Risk factors that contribute to an abdominal aortic aneurysm may include:  Age older than 60 years.   High blood pressure (hypertension).  Female gender.  Ethnicity (white race).  Obesity.  Family history of aneurysm (first degree relatives only).  Tobacco use. PREVENTION  The following healthy lifestyle habits may help decrease your risk of abdominal aortic aneurysm:  Quitting smoking. Smoking can raise your blood pressure and cause arteriosclerosis.  Limiting or avoiding alcohol.  Keeping your blood pressure, blood sugar level, and cholesterol levels within normal limits.  Decreasing your salt intake. In somepeople, too much salt can raise blood pressure and increase your risk of abdominal aortic aneurysm.  Eating a diet low in saturated fats and cholesterol.  Increasing your fiber intake by including  whole grains, vegetables, and fruits in your diet. Eating these foods may help lower blood pressure.  Maintaining a healthy weight.  Staying physically active and exercising regularly. SYMPTOMS  The symptoms of abdominal aortic aneurysm may vary depending on the size and rate of growth of the aneurysm.Most grow slowly and do not have any symptoms. When symptoms do occur, they may include:  Pain (abdomen, side, lower back, or groin). The pain may vary in intensity. A sudden onset of severe pain may indicate that the aneurysm has ruptured.  Feeling full after eating only small amounts of food.  Nausea or vomiting or both.  Feeling a pulsating lump in the abdomen.  Feeling faint or passing out. DIAGNOSIS  Since most unruptured abdominal aortic aneurysms have no symptoms, they are often discovered during diagnostic exams for other conditions. An aneurysm may be found during the following procedures:  Ultrasonography (A one-time screening for abdominal aortic aneurysm by ultrasonography is also recommended for all men aged 65-75 years who have ever smoked).  X-ray exams.  A computed tomography (CT).  Magnetic resonance imaging (MRI).  Angiography or arteriography. TREATMENT  Treatment of an abdominal aortic aneurysm depends on the size of your aneurysm, your age, and risk factors for rupture. Medication to control blood pressure and pain may be used to manage aneurysms smaller than 6 cm. Regular monitoring for enlargement may be recommended by your caregiver if:  The aneurysm is 3-4 cm in size (an annual ultrasonography may be recommended).  The aneurysm is 4-4.5 cm in size (an ultrasonography every 6 months may be recommended).  The aneurysm is larger than 4.5 cm in   size (your caregiver may ask that you be examined by a vascular surgeon). If your aneurysm is larger than 6 cm, surgical repair may be recommended. There are two main methods for repair of an aneurysm:   Endovascular  repair (a minimally invasive surgery). This is done most often.  Open repair. This method is used if an endovascular repair is not possible. Document Released: 09/17/2005 Document Revised: 04/04/2013 Document Reviewed: 01/07/2013 Exit[employer] Patient Information 2015 [employer]. This information is not intended to replace advice given to you by your health care provider. Make sure you discuss any questions you have with your health care provider.  

## 2014-11-10 NOTE — Progress Notes (Signed)
[facility] [place]  Established Open AAA Repair  History of Present Illness  Julie Holder is a 53 y.o. (April 04, 1961) female patient of Dr/ Kellie Simmering who is S/P open AAA repair on 05-03-14. CT angiogram performed on Apr 28 2014.  Aneurysm measured 6.3 cm in maximum diameter and extended up to the renal arteries with a very short-less than 5 mm-neck below the renal arteries. 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 Patient denies any new symptoms of abdominal or back pain. She has no history of coronary artery disease, CVA, TIAs, diabetes mellitus.   She presents today with C/O drainage, from her umbilicus which she reports as crusting, and abdominal pain since starting a job that requires lifting trays and boxes. She started a new job the first or second week in Sept 2015, was told she would not be lifting more than 50 pounds. Patient reports she chronically lifts trays or boxes up to 45 pounds; shortly after doing this she noticed a burning sensation when she lifts these things, also when she turns in bed. Her hands are intermittently numb, this started about 3 weeks ago.   She sees Dr. Delilah Shan for lumbar strain, this is how her AAA was incidentally found as part of her back evaluation. She will see Dr. Nelva Bush to evaluate for possible spinal injections. She has also had physical therapy.  Pt Diabetic: No Pt smoker: former smoker, quit in 2010  Past Medical History  Diagnosis Date  . Chicken pox   . Hypertension   . Hyperlipidemia   . Hidradenitis suppurativa   . Anemia     Past Surgical History  Procedure Laterality Date  . Hand surgery  1975  . Colonoscopy w/ biopsies and polypectomy    . Abdominal aortic aneurysm repair N/A 05/03/2014    Procedure: RESECTION AND GRAFTING ANEURYSM ABDOMINAL AORTIC WITH BILATERAL COMMON ILIAC GRAFTING USING 18X9 HEMASHIELD GRAFT;  Surgeon: Mal Misty, MD;   Location: [facility];  Service: Vascular;  Laterality: N/A;   Social History History   Social History  . Marital Status: Single    Spouse Name: N/A    Number of Children: N/A  . Years of Education: N/A   Occupational History  . Not on file.   Social History Main Topics  . Smoking status: Former Smoker -- 0.30 packs/day for 30 years    Types: Cigarettes    Quit date: 01/21/2009  . Smokeless tobacco: Never Used  . Alcohol Use: No  . Drug Use: No  . Sexual Activity: Not on file   Other Topics Concern  . Not on file   Social History Narrative   Family History Family History  Problem Relation Age of Onset  . Heart disease Mother     CHF  . Hyperlipidemia Mother   . Kidney disease Mother   . Diabetes Mother   . Hypertension Mother   . Hyperlipidemia Sister   . Hypertension Sister   . Hyperlipidemia Brother   . Diabetes Brother   . Hypertension Brother   . Colon cancer Neg Hx   . Cancer Brother   . Cancer Father   . Hypertension Father   . Hyperlipidemia Father   . Hyperlipidemia Son    Current Outpatient Prescriptions on File Prior to Visit  Medication Sig Dispense Refill  . cholecalciferol (VITAMIN D) 400 UNITS TABS Take 400 Units by mouth daily.    . Coenzyme  Q10 (COQ10) 100 MG CAPS Take 100 mg by mouth 2 (two) times daily.    Marland Kitchen docusate sodium (COLACE) 100 MG capsule Take 100 mg by mouth 2 (two) times daily.    Marland Kitchen lisinopril-hydrochlorothiazide (PRINZIDE,ZESTORETIC) 10-12.5 MG per tablet Take 1 tablet by mouth daily.    . mupirocin ointment (BACTROBAN) 2 % Apply 1 application topically 2 (two) times daily. Use daily to affected areas as needed per dermatologist    . pravastatin (PRAVACHOL) 40 MG tablet Take 20 mg by mouth 2 (two) times daily.    . traMADol (ULTRAM) 50 MG tablet Take 1 tablet (50 mg total) by mouth every 6 (six) hours as needed. Per dermatologist 30 tablet 0  . Omega-3 Fatty Acids (FISH OIL) 1200 MG CAPS Take 1,200 mg by mouth 2 (two) times daily.     Marland Kitchen oxyCODONE (OXY IR/ROXICODONE) 5 MG immediate release tablet Take 1 tablet (5 mg total) by mouth every 4 (four) hours as needed for severe pain. 30 tablet 0  . Red Yeast Rice 600 MG CAPS Take 600 mg by mouth 2 (two) times daily.     No current facility-administered medications on file prior to visit.   Allergies  Allergen Reactions  . Amoxicillin Shortness Of Breath  . Fish Allergy Anaphylaxis, Itching and Rash    Shell fish  . Claravis [Isotretinoin]     Muscle spasms  . Penicillins     hives  . Statins Other (See Comments)    Muscle spasms  . Aspirin Other (See Comments)    unknown  . Tape Rash    ROS: See HPI for pertinent positives and negatives.    Physical Examination  Filed Vitals:   11/10/14 1256  BP: 105/64  Pulse: 57  Temp: 97.1 F (36.2 C)  TempSrc: Oral  Resp: 14  Height: 5\' 5"  (1.651 m)  Weight: 241 lb (109.317 kg)  SpO2: 100%   Body mass index is 40.1 kg/(m^2).  General: A&O x 3, WD, morbidly obese female.  Pulmonary: Sym exp, good air movt, CTAB, no rales, rhonchi, or wheezing.   Cardiac: RRR, Nl S1, S2, no detected murmur.   Carotid Bruits Right Left   Negative Negative    Aorta is not palpable. Radial pulses are 2+ and equal.                         VASCULAR EXAM: Extremities without ischemic changes  without Gangrene; without open wounds.                                                                                                          LE Pulses Right Left       FEMORAL  not palpable   2+ palpable        POPLITEAL  not palpable   not palpable       POSTERIOR TIBIAL  not palpable   not palpable        DORSALIS PEDIS      ANTERIOR TIBIAL not palpable  2+ palpable  Gastrointestinal: soft, mildly tender to palpation in left lower quadrant, -G/R, - HSM, - masses palpated, - CVAT B. No drainage from umbilicus, no open wounds. Large panus.  Musculoskeletal: M/S 5/5 throughout, Extremities without ischemic  changes.  Neurologic: Pain and light touch intact in extremities, Motor exam as listed above.    Medical Decision Making  [name] is a 53 y.o. female who is S/P open AAA repair on 05-03-14. She returns today with c/o abdominal pain with chronic lifting of trays and boxes since starting a job that requires this in September 2015. She also reports this abdominal pain with turning in bed.  Based on today's exam and HPI, and after discussing with Dr. Bridgett Larsson, patient is advised to follow up in May 2016 as scheduled with ABI's and see me. Her abdominal pain and drainage from her umbilicus is not related to her AAA surgery, the umbilicus is not incised for this surgery; pt advised to see her PCP re her symptoms.   Clemon Chambers, RN, MSN, FNP-C [facility] Office: [phone]   Clinic Physician: Bridgett Larsson   11/10/2014, 1:05 PM

## 2014-11-13 ENCOUNTER — Encounter: Payer: Self-pay | Admitting: Family Medicine

## 2014-11-13 ENCOUNTER — Ambulatory Visit (INDEPENDENT_AMBULATORY_CARE_PROVIDER_SITE_OTHER): Payer: 59 | Admitting: Family Medicine

## 2014-11-13 VITALS — BP 132/68 | HR 74 | Temp 98.0°F | Wt 245.0 lb

## 2014-11-13 DIAGNOSIS — R109 Unspecified abdominal pain: Secondary | ICD-10-CM

## 2014-11-13 DIAGNOSIS — R198 Other specified symptoms and signs involving the digestive system and abdomen: Secondary | ICD-10-CM

## 2014-11-13 MED ORDER — NYSTATIN 100000 UNIT/GM EX CREA: 1.0000 "application " | TOPICAL_CREAM | Freq: Two times a day (BID) | CUTANEOUS | Status: DC

## 2014-11-13 NOTE — Patient Instructions (Signed)

## 2014-11-13 NOTE — Progress Notes (Signed)
Subjective:    Patient ID: Julie Holder, female    DOB: [birth year], 53 y.o.   MRN: [id number]  HPI Patient had abdominal aortic aneurysm repair last May. She is seen today with some complaints of somewhat superficial burning like pain abdomen just left of midline and just lateral to her scar. She was seen by vein and vascular surgery few days ago was felt that her current pain was not related to her surgery. She's not had any nausea or vomiting. No appetite or weight changes. No stool changes. Prior history of diverticulosis but no history of diverticulitis. No fevers or chills.  She did relate a few days ago having a little bit of crusted drainage from her umbilical area and mild periumbilical pain. Not aware of any umbilical or abdominal wall hernias. No recent skin rash otherwise.  Her current job restriction is no lifting over 50 pounds. She is not lifting over that weight but frequently has to lift things with 40 pound range repetitively. She feels her job may be exacerbating her abdominal pain.  Past Medical History  Diagnosis Date  . Chicken pox   . Hypertension   . Hyperlipidemia   . Hidradenitis suppurativa   . Anemia    Past Surgical History  Procedure Laterality Date  . Hand surgery  1975  . Colonoscopy w/ biopsies and polypectomy    . Abdominal aortic aneurysm repair N/A 05/03/2014    Procedure: RESECTION AND GRAFTING ANEURYSM ABDOMINAL AORTIC WITH BILATERAL COMMON ILIAC GRAFTING USING 18X9 HEMASHIELD GRAFT;  Surgeon: Mal Misty, MD;  Location: [facility];  Service: Vascular;  Laterality: N/A;    reports that she quit smoking about 5 years ago. Her smoking use included Cigarettes. She has a 9 pack-year smoking history. She has never used smokeless tobacco. She reports that she does not drink alcohol or use illicit drugs. family history includes Cancer in her brother and father; Diabetes in her brother and mother; Heart disease in her mother; Hyperlipidemia in her brother,  father, mother, sister, and son; Hypertension in her brother, father, mother, and sister; Kidney disease in her mother. There is no history of Colon cancer. Allergies  Allergen Reactions  . Amoxicillin Shortness Of Breath  . Fish Allergy Anaphylaxis, Itching and Rash    Shell fish  . Claravis [Isotretinoin]     Muscle spasms  . Penicillins     hives  . Statins Other (See Comments)    Muscle spasms  . Aspirin Other (See Comments)    unknown  . Tape Rash      Review of Systems  Constitutional: Negative for fever and chills.  Respiratory: Negative for cough and shortness of breath.   Cardiovascular: Negative for chest pain.  Gastrointestinal: Positive for abdominal pain. Negative for nausea, vomiting, diarrhea, constipation, blood in stool and abdominal distention.  Skin: Negative for rash.       Objective:   Physical Exam  Constitutional: She appears well-developed and well-nourished.  Cardiovascular: Normal rate and regular rhythm.   Pulmonary/Chest: Effort normal and breath sounds normal. No respiratory distress. She has no wheezes. She has no rales.  Abdominal: Soft. Bowel sounds are normal. She exhibits no distension and no mass. There is no rebound and no guarding.  She has large scar from previous abdominal aortic aneurysm repair. She has some minimal tenderness just lateral to her scar lateral to umbilicus. No abdominal wall hernias. No tenderness left lower quadrant. No splenomegaly or hepatomegaly.  Skin:  Umbilical area reveals some  moisture but no erythema or cellulitis changes. No purulent drainage. Nontender Few very superficial whitish colored patches.          Assessment & Plan:  Abdominal pain. She describes a burning type pain near her scar tissue/incision site and wonder this may be scar related pain. She is not have evidence for abdominal wall hernia we can appreciate. This pain appears to be more superficial. We did not see a clear indication for  follow-up abdominal CT at this point. There is no evidence for clear infectious etiology- no fever, etc. Umbilical area reveals no cellulitis.  Does have some nonspecific moisture and ?early whitish exudate c/w possible early candida.  Nystatin cream BID and keep dry.

## 2014-11-13 NOTE — Progress Notes (Signed)
Pre visit review using our clinic review tool, if applicable. No additional management support is needed unless otherwise documented below in the visit note. 

## 2014-11-20 ENCOUNTER — Telehealth: Payer: Self-pay

## 2014-11-20 MED ORDER — LISINOPRIL-HYDROCHLOROTHIAZIDE 10-12.5 MG PO TABS: 1.0000 | ORAL_TABLET | Freq: Every day | ORAL | Status: DC

## 2014-11-20 NOTE — Telephone Encounter (Signed)
Rx sent to pharmacy   

## 2014-11-20 NOTE — Telephone Encounter (Signed)
Rx request from [facility] for lisinopril-HCTZ 10-12.5 mg #90.  Pls advise.

## 2014-11-23 ENCOUNTER — Ambulatory Visit
Admission: RE | Admit: 2014-11-23 | Discharge: 2014-11-23 | Disposition: A | Discharge status: Home / Self Care | Payer: 59 | Source: Ambulatory Visit | Attending: Family Medicine | Admitting: Family Medicine

## 2014-11-23 DIAGNOSIS — R928 Other abnormal and inconclusive findings on diagnostic imaging of breast: Secondary | ICD-10-CM

## 2014-12-29 ENCOUNTER — Ambulatory Visit: Payer: PRIVATE HEALTH INSURANCE | Attending: Orthopedic Surgery | Admitting: Physical Therapy

## 2014-12-29 ENCOUNTER — Encounter: Payer: Self-pay | Admitting: Physical Therapy

## 2014-12-29 DIAGNOSIS — M545 Low back pain, unspecified: Secondary | ICD-10-CM

## 2014-12-29 NOTE — Therapy (Signed)
[place], [place], [address] Phone: [phone]   Fax:  [phone]  Physical Therapy Evaluation  Patient Details  Name: Julie Holder MRN: [id number] Date of Birth: 02/07/1961 Referring Provider:  Melina Schools, MD  Encounter Date: 12/29/2014      PT End of Session - 12/29/14 0920    Visit Number 1   Number of Visits 16   Date for PT Re-Evaluation 02/23/15   PT Start Time 0810   PT Stop Time 0915   PT Time Calculation (min) 65 min   Activity Tolerance Patient tolerated treatment well      Past Medical History  Diagnosis Date  . Chicken pox   . Hypertension   . Hyperlipidemia   . Hidradenitis suppurativa   . Anemia     Past Surgical History  Procedure Laterality Date  . Hand surgery  1975  . Colonoscopy w/ biopsies and polypectomy    . 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  Out of work 4-6 weeks.  Lift limit of 20#.  No bending or stooping.   Dr. Rolena Infante said she didn't need surgery and referred her back to Dr. Nelva Bush for injections.     Pertinent History AAA surgery in June 50# lift limit   Limitations Lifting;House hold activities;Standing;Walking   How long can you sit comfortably? 30 min   How long can you walk comfortably? 30 min;  knee buckles with walking more   Diagnostic tests Had an MRI which revealed her AAA.  Patient states her back has arthritis.     Currently in Pain? Yes   Pain Score 5    Pain Location Back   Pain Orientation Right;Left   Pain Descriptors / Indicators Aching   Pain Type Chronic pain   Pain Onset More than a month ago   Pain Frequency Constant   Aggravating Factors  bending; lifting a box down   Pain Relieving Factors ibuprofen; prefers sitting          [facility] PT Assessment - 12/29/14 0820    Assessment   Medical Diagnosis --  LBP   Onset Date --  1 year   Next MD Visit --  Dr. Nelva Bush 1/21   Prior Therapy --  Sept '15; did stretches, bridges, "helped"   Precautions   Precautions Other (comment)   Precaution Comments --  No lift 20#.  out of work 4-6 weeks   Restrictions   Weight Bearing Restrictions No   Balance Screen   Has the patient fallen in the past 6 months No   Has the patient had a decrease in activity level because of a fear of falling?  No   Is the patient reluctant to leave their home because of a fear of falling?  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   Vocation Requirements --  stand on concrete floor, lifting   Leisure --  cook, read   Observation/Other Assessments   Oswestry Disability Index  --  Modified 42% "severe" disability   Posture/Postural Control   Posture Comments --  decreased lumbar lordosis   AROM   Lumbar Flexion 45   Lumbar Extension 30   Lumbar - Right Side Bend 36   Lumbar - Left Side Bend 33   Strength   Right Hip ABduction 4/5   Left Hip ABduction --  4-/5   Right Knee Flexion 5/5   Right Knee Extension 4/5   Left Knee Flexion 5/5   Left Knee Extension 4/5   Lumbar Flexion 3-/5   Lumbar Extension 3-/5   Slump test    Findings Negative   Side Left   Straight Leg Raise   Findings Negative   Side  Left   Comment --  HS length left 65,right 70                  [facility] Adult PT Treatment/Exercise - 12/29/14 0820    Moist Heat Therapy   Number Minutes Moist Heat 12 Minutes   Moist Heat Location --  lumbar seated                  PT Short Term Goals - 12/29/14 0934    PT SHORT TERM GOAL #1   Title "Independent with initial HEP   Time 4   Period Weeks   Status New   PT SHORT TERM GOAL #2   Title The patient will demonstrate improved body mechanics, including hip hingeing with lifting light objects 5# as needed for work duties in the kitchen   Time 4   Period Weeks   Status New   PT SHORT TERM GOAL #3   Title Modifited Oswestry score improved from 42% to 36% indicating improved function with less pain.   Time 4   Period Weeks   Status New           PT Long Term Goals - 12/29/14 1204    PT LONG TERM GOAL #1   Title "Demonstrate and verbalize techniques to reduce the risk of re-injury including: lifting, posture, body mechanics needed for work duties in a kitchen (including retrieving item off top shelf)   Time 8   Period Weeks   Status New   PT LONG TERM GOAL #2   Title "Pt will be independent with advanced HEP.    Time 8   Period Weeks   Status New   PT LONG TERM GOAL #3   Title Modified Oswestry score improved from 42% to 30% indicating improved function with less pain   Time 8   Period Weeks   Status New   PT LONG TERM GOAL #4   Title Transverse abdominals activation and core strength to 4-/5 needed for standing and walking for longer periods of time in the community and at work.    Time 8   Period Weeks   Status New   PT LONG TERM GOAL #5   Title Patient will have adequate spinal and hip ROM needed to lift a 10# object from knee level with proper body mechanics.     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  Her pain is worse with bending, standing and walking.  Somewhat better with sitting.   While having an MRI on her lumbar spine, it was discovered that she had a large AAA.  She had surgery for this in June.  She has had previous PT for LBP  with some improvement until she was transferred to the kitchen to work.  As a result she has increased LBP.  When she walks too long she will get a sharp pain and her left knee feels like it is buckling.   Decreased lumbar lordosis.   She states Dr. Rolena Infante said she didn't need surgery and referred her to PT  and back to Dr. Nelva Bush for injections.  Painful and limited lumbar AROM in all planes.  Decreased activation of transverse abdominals 3-/5 as expected especially with major abdominal surgery.  Weak trunk extensors 3-/5.  Left hip abd strength 4-/5, right 4/5.  Negative SLR, slump tests bilaterally.    Pt will benefit from skilled therapeutic intervention in order to improve on the following deficits Decreased activity tolerance;Decreased range of motion;Decreased strength;Pain;Increased muscle spasms;Improper body mechanics   Rehab Potential Good   Clinical Impairments Affecting Rehab Potential co-morbidities including AAA in June '15   PT Frequency 2x / week   PT Duration 8 weeks   PT Treatment/Interventions ADLs/Self Care Home Management;Cryotherapy;Electrical Stimulation;Moist Heat;Therapeutic exercise;Neuromuscular re-education;Therapeutic activities;Patient/family education;Manual techniques   PT Next Visit Plan Low level abdominal bracing progression;  no directional preference identified;  patient unable to lie prone since abdominal surgery; low-level trunk extensor and gluteal strengthening ; heat/e-stim for pain control         Problem List Patient Active Problem List    Diagnosis Date Noted  . Aftercare following surgery of the circulatory system 11/10/2014  . Drainage from wound 11/10/2014  . Lower abdominal pain 11/10/2014  . AAA (abdominal aortic aneurysm) without rupture 07/25/2014  . AAA (abdominal aortic aneurysm) 05/03/2014  . Abdominal aneurysm without mention of rupture 04/25/2014  . Severe obesity (BMI >= 40) 04/17/2014  . Hyperlipidemia 10/22/2011  . Elevated blood pressure 10/22/2011  . Hidradenitis suppurativa 10/22/2011    Alvera Singh 12/29/2014, 12:13 PM  [facility] [address] [place], [place], [address] Phone: [phone]   Fax:  [phone]   Ruben Im, PT 12/29/2014 12:15 PM Phone: [phone] Fax: [phone]

## 2015-01-08 ENCOUNTER — Ambulatory Visit: Payer: PRIVATE HEALTH INSURANCE | Attending: Family Medicine | Admitting: Physical Therapy

## 2015-01-08 DIAGNOSIS — M545 Low back pain, unspecified: Secondary | ICD-10-CM

## 2015-01-08 NOTE — Therapy (Signed)
[place], [place], [address] Phone: [phone]   Fax:  [phone]  Physical Therapy Treatment  Patient Details  Name: Julie Holder MRN: [id number] Date of Birth: 1961/08/27 Referring Provider:  Eulas Post, MD  Encounter Date: 01/08/2015      PT End of Session - 01/08/15 0844    Visit Number 2   Number of Visits 16   Date for PT Re-Evaluation 02/23/15   PT Start Time 0802   PT Stop Time 2409   PT Time Calculation (min) 42 min   Activity Tolerance Patient tolerated treatment well      Past Medical History  Diagnosis Date  . Chicken pox   . Hypertension   . Hyperlipidemia   . Hidradenitis suppurativa   . Anemia     Past Surgical History  Procedure Laterality Date  . Hand surgery  1975  . Colonoscopy w/ biopsies and polypectomy    . Abdominal aortic aneurysm repair N/A 05/03/2014    Procedure: RESECTION AND GRAFTING ANEURYSM ABDOMINAL AORTIC WITH BILATERAL COMMON ILIAC GRAFTING USING 18X9 HEMASHIELD GRAFT;  Surgeon: Mal Misty, MD;  Location: [facility] OR;  Service: Vascular;  Laterality: N/A;    LMP 05/31/2011  Visit Diagnosis:  Bilateral low back pain without sciatica      Subjective Assessment - 01/08/15 0806    Symptoms 4/10 helpful   Currently in Pain? Yes   Pain Score 4    Pain Location Back   Pain Orientation Posterior   Pain Descriptors / Indicators Sharp   Aggravating Factors  bending, walking, Lt leg quick buckle with pain, pain wakes up at night.   Pain Relieving Factors ibuprophen   Effect of Pain on Daily Activities limits lifting   Multiple Pain Sites No                    [facility] Adult PT Treatment/Exercise - 01/08/15 0818    Lumbar Exercises: Supine   Ab Set 5 reps;5 seconds  with breathing.   Clam 5 reps  with abdominal bracing and breathing.   Heel Slides 5 reps  With  instruction   Bridge 10 reps  0 holds   Lumbar Exercises: Sidelying   Clam 10 reps  with cues. each side                PT Education - 01/08/15 0843    Education provided Yes   Education Details beginner home exercise, bridge, trunk rotation, abdominal set., posture for bed use of pillows. and sitting.   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    Time 8   Period Weeks   Status On-going   PT LONG TERM GOAL #3   Title Modified Oswestry score improved from 42% to 30% indicating improved function with less pain   Time 8   Period Weeks   Status Unable to assess   PT LONG TERM GOAL #4   Title Transverse abdominals activation and core strength to 4-/5 needed for standing and walking for longer periods of time in the community and at work.    Time 8   Status On-going   PT LONG TERM GOAL #5   Title Patient will have adequate spinal and hip ROM needed to lift a 10# object from knee level with proper body mechanics.     Time 8   Period Weeks    Status Unable to assess               Plan - 01/08/15 0844    Clinical Impression Statement patient able to begin home exercise and education for body mechanics.  She is well known here and already has some knowledge of exercise and body mechanics.  No pain increased with session 4/10   PT Next Visit Plan review and build home exercises and body mechanics handout.   Consulted and Agree with Plan of Care Patient        Problem List Patient Active Problem List   Diagnosis Date Noted  . Aftercare following surgery of the circulatory system 11/10/2014  . Drainage from wound 11/10/2014  . Lower abdominal pain 11/10/2014  . AAA (abdominal aortic aneurysm) without rupture 07/25/2014  . AAA (abdominal aortic aneurysm) 05/03/2014  . Abdominal aneurysm without mention of rupture 04/25/2014  . Severe obesity (BMI >= 40) 04/17/2014  . Hyperlipidemia 10/22/2011  . Elevated blood pressure 10/22/2011  . Hidradenitis suppurativa 10/22/2011  Melvenia Needles, PTA 01/08/2015 8:49 AM Phone: [phone] Fax: [phone]   [facility] 01/08/2015, 8:49 AM  [facility] [address] [place], [place], [address] Phone: [phone]   Fax:  [phone]

## 2015-01-08 NOTE — Patient Instructions (Signed)
Bridge   Lie back, legs bent. Inhale, pressing hips up. Keeping ribs in, lengthen lower back. Exhale, rolling down along spine from top. Repeat ___10_ times. Do _1-2___ sessions per day.  Copyright  VHI. All rights reserved.   Pelvic Tilt   Flatten back by tightening stomach muscles and buttocks. Repeat __5__ times per set. Do _1___ sets per session. Do __1-2__ sessions per day.  http://orth.exer.us/134   Copyright  VHI. All rights reserved. Knee to Chest (Flexion) Copyright  VHI. All rights reserved.   Lower Trunk Rotation Stretch   Keeping back flat and feet together, rotate knees to left side. Hold ___5-10_ seconds. Repeat __5__ times per set. Do __1__ sets per session. Do __1-2__ sessions per day.  http://orth.exer.us/122   Copyright  VHI. All rights reserved.

## 2015-01-10 ENCOUNTER — Ambulatory Visit: Payer: PRIVATE HEALTH INSURANCE | Admitting: Physical Therapy

## 2015-01-10 DIAGNOSIS — M545 Low back pain, unspecified: Secondary | ICD-10-CM

## 2015-01-10 NOTE — Therapy (Signed)
[place], [place], [address] Phone: [phone]   Fax:  [phone]  Physical Therapy Treatment  Patient Details  Name: Julie Holder MRN: [id number] Date of Birth: 08-22-1961 Referring Provider:  Eulas Post, MD  Encounter Date: 01/10/2015      PT End of Session - 01/10/15 0842    Visit Number 3   Number of Visits 16   Date for PT Re-Evaluation 02/23/15   PT Start Time 0801   PT Stop Time 6160   PT Time Calculation (min) 43 min   Activity Tolerance Patient tolerated treatment well;Patient limited by pain      Past Medical History  Diagnosis Date  . Chicken pox   . Hypertension   . Hyperlipidemia   . Hidradenitis suppurativa   . Anemia     Past Surgical History  Procedure Laterality Date  . Hand surgery  1975  . Colonoscopy w/ biopsies and polypectomy    . Abdominal aortic aneurysm repair N/A 05/03/2014    Procedure: RESECTION AND GRAFTING ANEURYSM ABDOMINAL AORTIC WITH BILATERAL COMMON ILIAC GRAFTING USING 18X9 HEMASHIELD GRAFT;  Surgeon: Mal Misty, MD;  Location: [place];  Service: Vascular;  Laterality: N/A;    LMP 05/31/2011  Visit Diagnosis:  Bilateral low back pain without sciatica      Subjective Assessment - 01/10/15 0807    Symptoms 4/10.  Doing exercises and they help                    [facility] Adult PT Treatment/Exercise - 01/10/15 0810    Posture/Postural Control   Posture Comments ADL handout issued and reviewed.   Lumbar Exercises: Stretches   Passive Hamstring Stretch 3 reps;30 seconds  each   Single Knee to Chest Stretch 5 reps;10 seconds   Lower Trunk Rotation --  5 seconds   Piriformis Stretch 3 reps;10 seconds  manually   Lumbar Exercises: Supine   Ab Set 10 reps;5 seconds   Bridge 10 reps  good breathing observed.   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    Time 8   Period Weeks   Status On-going   PT LONG TERM GOAL #3   Title Modified Oswestry score improved from 42% to 30% indicating improved function with less pain   Time 8   Period Weeks   Status Unable to assess   PT LONG TERM GOAL #4   Title Transverse abdominals activation and core strength to 4-/5 needed for standing and walking for longer periods of time in the community and at work.    Time 8   Status On-going   PT LONG TERM GOAL #5   Title Patient will have adequate spinal and hip ROM needed to lift a 10# object from knee level with proper body mechanics.     Time 8   Period Weeks   Status Unable to assess               Plan - 01/10/15 0841    Clinical Impression Statement Adherent with home exercise.  Progress toward flexibility goals 104 degrees hip flexion.   PT  Next Visit Plan practice pushing., core try wall slides, calf stretch        Problem List Patient Active Problem List   Diagnosis Date Noted  . Aftercare following surgery of the circulatory system 11/10/2014  . Drainage from wound 11/10/2014  . Lower abdominal pain 11/10/2014  . AAA (abdominal aortic aneurysm) without rupture 07/25/2014  . AAA (abdominal aortic aneurysm) 05/03/2014  . Abdominal aneurysm without mention of rupture 04/25/2014  . Severe obesity (BMI >= 40) 04/17/2014  . Hyperlipidemia 10/22/2011  . Elevated blood pressure 10/22/2011  . Hidradenitis suppurativa 10/22/2011   Melvenia Needles, PTA 01/10/2015 8:47 AM Phone: [phone] Fax: [phone]  [facility] 01/10/2015, 8:47 AM  [facility] [address] [place], [place], [address] Phone: [phone]   Fax:  [phone]

## 2015-01-10 NOTE — Patient Instructions (Signed)

## 2015-01-15 ENCOUNTER — Ambulatory Visit: Payer: PRIVATE HEALTH INSURANCE | Admitting: Rehabilitation

## 2015-01-17 ENCOUNTER — Ambulatory Visit: Payer: PRIVATE HEALTH INSURANCE | Admitting: Rehabilitation

## 2015-01-17 DIAGNOSIS — M545 Low back pain, unspecified: Secondary | ICD-10-CM

## 2015-01-17 NOTE — Therapy (Signed)
[place], [place], [address] Phone: [phone]   Fax:  [phone]  Physical Therapy Treatment  Patient Details  Name: Julie Holder MRN: [id number] Date of Birth: Mar 27, 1961 Referring Provider:  Eulas Post, MD  Encounter Date: 01/17/2015      PT End of Session - 01/17/15 0850    Visit Number 4   Number of Visits 16   Date for PT Re-Evaluation 02/23/15   PT Start Time 3295   PT Stop Time 0930   PT Time Calculation (min) 43 min      Past Medical History  Diagnosis Date  . Chicken pox   . Hypertension   . Hyperlipidemia   . Hidradenitis suppurativa   . Anemia     Past Surgical History  Procedure Laterality Date  . Hand surgery  1975  . Colonoscopy w/ biopsies and polypectomy    . Abdominal aortic aneurysm repair N/A 05/03/2014    Procedure: RESECTION AND GRAFTING ANEURYSM ABDOMINAL AORTIC WITH BILATERAL COMMON ILIAC GRAFTING USING 18X9 HEMASHIELD GRAFT;  Surgeon: Mal Misty, MD;  Location: [place];  Service: Vascular;  Laterality: N/A;    LMP 05/31/2011  Visit Diagnosis:  Bilateral low back pain without sciatica      Subjective Assessment - 01/17/15 0837    Symptoms 5/10. They took me out of work.   Currently in Pain? Yes   Pain Score 5    Pain Location Back   Pain Orientation Mid;Lower;Right;Left   Pain Descriptors / Indicators Aching   Aggravating Factors  walking >30 min causes sharp pain and leg buckle on left   Pain Relieving Factors Bridge, lumbar roll                    [facility] Adult PT Treatment/Exercise - 01/17/15 1028    Lumbar Exercises: Supine   Ab Set 10 reps;5 seconds   Clam 10 reps   Heel Slides 10 reps   Bridge 10 reps  the with clams     Therapeutic Activity: Sled Push 75ft x 6 with cues for core bracing and posture. 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    Time 8   Period Weeks   Status On-going   PT LONG TERM GOAL #3   Title Modified Oswestry score improved from 42% to 30% indicating improved function with less pain   Time 8   Period Weeks   Status Unable to assess   PT LONG TERM GOAL #4   Title Transverse abdominals activation and core strength to 4-/5 needed for standing and walking for longer periods of time in the community and at work.    Time 8   Status On-going   PT LONG TERM GOAL #5   Title Patient will have adequate spinal and hip ROM needed to lift a 10# object from knee level with proper body mechanics.     Time 8   Period Weeks   Status Unable to assess               Plan - 01/17/15 1031    Clinical Impression Statement Able to return demonstrate proper body mechanics with  pushing weighted sled in clinic and placing/removing light box from top shelf. She required demonstration and verbal cues throughout however LBP remained at the same level, 5/10    PT Next Visit Plan wall slides, calf stretch, continue core        Problem List Patient Active Problem List   Diagnosis Date Noted  . Aftercare following surgery of the circulatory system 11/10/2014  . Drainage from wound 11/10/2014  . Lower abdominal pain 11/10/2014  . AAA (abdominal aortic aneurysm) without rupture 07/25/2014  . AAA (abdominal aortic aneurysm) 05/03/2014  . Abdominal aneurysm without mention of rupture 04/25/2014  . Severe obesity (BMI >= 40) 04/17/2014  . Hyperlipidemia 10/22/2011  . Elevated blood pressure 10/22/2011  . Hidradenitis suppurativa 10/22/2011    Dorene Ar, PTA 01/17/2015, 10:32 AM  [place], [place], [address] Phone: [phone]   Fax:  [phone]

## 2015-01-17 NOTE — Patient Instructions (Signed)

## 2015-01-22 ENCOUNTER — Ambulatory Visit: Payer: PRIVATE HEALTH INSURANCE | Attending: Sports Medicine | Admitting: Rehabilitation

## 2015-01-22 DIAGNOSIS — M545 Low back pain, unspecified: Secondary | ICD-10-CM

## 2015-01-22 DIAGNOSIS — Z5189 Encounter for other specified aftercare: Secondary | ICD-10-CM | POA: Diagnosis present

## 2015-01-22 NOTE — Therapy (Signed)
[place], [place], [address] Phone: [phone]   Fax:  [phone]  Physical Therapy Treatment  Patient Details  Name: Julie Holder MRN: [id number] Date of Birth: 05-12-1961 Referring Provider:  Eulas Post, MD  Encounter Date: 01/22/2015      PT End of Session - 01/22/15 0805    Visit Number 5   Number of Visits 16   Date for PT Re-Evaluation 02/23/15   PT Start Time 0803      Past Medical History  Diagnosis Date  . Chicken pox   . Hypertension   . Hyperlipidemia   . Hidradenitis suppurativa   . Anemia     Past Surgical History  Procedure Laterality Date  . Hand surgery  1975  . Colonoscopy w/ biopsies and polypectomy    . Abdominal aortic aneurysm repair N/A 05/03/2014    Procedure: RESECTION AND GRAFTING ANEURYSM ABDOMINAL AORTIC WITH BILATERAL COMMON ILIAC GRAFTING USING 18X9 HEMASHIELD GRAFT;  Surgeon: Mal Misty, MD;  Location: [place];  Service: Vascular;  Laterality: N/A;    LMP 05/31/2011  Visit Diagnosis:  Bilateral low back pain without sciatica      Subjective Assessment - 01/22/15 0803    Currently in Pain? Yes   Pain Score 4    Pain Location Back   Pain Orientation Right;Left;Mid;Lower   Pain Descriptors / Indicators Aching   Pain Type Chronic pain   Pain Frequency Constant   Aggravating Factors  walking greater than 30 min, bending over to tie shoe, reaching up high   Pain Relieving Factors lumbar roll, bridge and rotation exercises          [facility] PT Assessment - 01/22/15 0819    AROM   Lumbar Flexion 70   Lumbar Extension 30                  [facility] Adult PT Treatment/Exercise - 01/22/15 0810    Lumbar Exercises: Aerobic   Stationary Bike Nustep L 3 UE/LE x 5 min  cues for posture and abdominal bracing   Lumbar Exercises: Standing   Wall Slides 10 reps  cues for abdominal bracing, neutral spine   Lumbar Exercises: Supine   Ab Set 10  reps;5 seconds   Clam 10 reps   Heel Slides 10 reps   Bridge 10 reps  also bridge with 2 clams x 5   Lumbar Exercises: Sidelying   Clam 15 reps  cues for abdominal bracing   Hip Abduction 10 reps  cues for position and ab bracing   Knee/Hip Exercises: Stretches   Gastroc Stretch 3 reps;30 seconds                  PT Short Term Goals - 01/10/15 3299    PT SHORT TERM GOAL #1   Title "Independent with initial HEP   Time 4   Status Achieved   PT SHORT TERM GOAL #2   Title The patient will demonstrate improved body mechanics, including hip hingeing with lifting light objects 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   Time 4   Period Weeks   Status Unable to assess           PT Long Term Goals - 01/08/15 2426  PT LONG TERM GOAL #1   Title "Demonstrate and verbalize techniques to reduce the risk of re-injury including: lifting, posture, body mechanics needed for work duties in a kitchen (including retrieving item off top shelf)   Time 8   Period Weeks   Status On-going   PT LONG TERM GOAL #2   Title "Pt will be independent with advanced HEP.    Time 8   Period Weeks   Status On-going   PT LONG TERM GOAL #3   Title Modified Oswestry score improved from 42% to 30% indicating improved function with less pain   Time 8   Period Weeks   Status Unable to assess   PT LONG TERM GOAL #4   Title Transverse abdominals activation and core strength to 4-/5 needed for standing and walking for longer periods of time in the community and at work.    Time 8   Status On-going   PT LONG TERM GOAL #5   Title Patient will have adequate spinal and hip ROM needed to lift a 10# object from knee level with proper body mechanics.     Time 8   Period Weeks   Status Unable to assess               Plan  - 01/22/15 0034    Clinical Impression Statement Lumbar flexion AROM improved. progressing toward strength goals. 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 Walking limited to 30 minutes.   PT Next Visit Plan review wall slides, calf stretch, continue core in supine and standing, standing theraband for postural strength?        Problem List Patient Active Problem List   Diagnosis Date Noted  . Aftercare following surgery of the circulatory system 11/10/2014  . Drainage from wound 11/10/2014  . Lower abdominal pain 11/10/2014  . AAA (abdominal aortic aneurysm) without rupture 07/25/2014  . AAA (abdominal aortic aneurysm) 05/03/2014  . Abdominal aneurysm without mention of rupture 04/25/2014  . Severe obesity (BMI >= 40) 04/17/2014  . Hyperlipidemia 10/22/2011  . Elevated blood pressure 10/22/2011  . Hidradenitis suppurativa 10/22/2011    Dorene Ar, PTA 01/22/2015, 8:43 AM  [facility] [address] [place], [place], [address] Phone: [phone]   Fax:  [phone]

## 2015-01-24 ENCOUNTER — Ambulatory Visit: Payer: PRIVATE HEALTH INSURANCE | Admitting: Rehabilitation

## 2015-01-24 DIAGNOSIS — M545 Low back pain, unspecified: Secondary | ICD-10-CM

## 2015-01-24 DIAGNOSIS — Z5189 Encounter for other specified aftercare: Secondary | ICD-10-CM | POA: Diagnosis not present

## 2015-01-24 NOTE — Therapy (Signed)
[place], [place], [address] Phone: [phone]   Fax:  [phone]  Physical Therapy Treatment  Patient Details  Name: Julie Holder MRN: [id number] Date of Birth: 06/26/1961 Referring Provider:  Pedro Earls, MD  Encounter Date: 01/24/2015      PT End of Session - 01/24/15 0843    Visit Number 6   Number of Visits 16   Date for PT Re-Evaluation 02/23/15   PT Start Time 0803   PT Stop Time 0843   PT Time Calculation (min) 40 min      Past Medical History  Diagnosis Date  . Chicken pox   . Hypertension   . Hyperlipidemia   . Hidradenitis suppurativa   . Anemia     Past Surgical History  Procedure Laterality Date  . Hand surgery  1975  . Colonoscopy w/ biopsies and polypectomy    . Abdominal aortic aneurysm repair N/A 05/03/2014    Procedure: RESECTION AND GRAFTING ANEURYSM ABDOMINAL AORTIC WITH BILATERAL COMMON ILIAC GRAFTING USING 18X9 HEMASHIELD GRAFT;  Surgeon: Mal Misty, MD;  Location: [facility];  Service: Vascular;  Laterality: N/A;    LMP 05/31/2011  Visit Diagnosis:  Bilateral low back pain without sciatica      Subjective Assessment - 01/24/15 0830    Symptoms Hurt a lot after standing on one foot to wash feet in the shower   Currently in Pain? Yes   Pain Score 5    Pain Location Back   Pain Orientation Right;Left;Lower   Pain Descriptors / Indicators Aching                    [facility] Adult PT Treatment/Exercise - 01/24/15 0831    Lumbar Exercises: Standing   Wall Slides 10 reps  cues for abdominal bracing, neutral spine, 2 sets   Other Standing Lumbar Exercises SLS with 3 way vectors focusing on core stability 10 x 2 each with and without UE support   Lumbar Exercises: Supine   Bridge 10 reps  also bridge with 5 clams x 3   Lumbar Exercises: Sidelying   Clam 20 reps  green band   Hip Abduction 10 reps  cues for position and ab bracing   Knee/Hip  Exercises: Stretches   Gastroc Stretch 3 reps;30 seconds                PT Education - 01/24/15 0843    Education provided Yes   Education Details HEP   Person(s) Educated Patient   Methods Explanation   Comprehension Verbalized understanding          PT Short Term Goals - 01/10/15 0808    PT SHORT TERM GOAL #1   Title "Independent with initial HEP   Time 4   Status Achieved   PT SHORT TERM GOAL #2   Title The patient will demonstrate improved body mechanics, including hip hingeing with lifting light objects 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   Time 4   Period Weeks   Status Unable to assess           PT Long Term Goals - 01/08/15 0847    PT LONG TERM GOAL #1   Title "Demonstrate and verbalize techniques to  reduce the risk of re-injury including: lifting, posture, body mechanics needed for work duties in a kitchen (including retrieving item off top shelf)   Time 8   Period Weeks   Status On-going   PT LONG TERM GOAL #2   Title "Pt will be independent with advanced HEP.    Time 8   Period Weeks   Status On-going   PT LONG TERM GOAL #3   Title Modified Oswestry score improved from 42% to 30% indicating improved function with less pain   Time 8   Period Weeks   Status Unable to assess   PT LONG TERM GOAL #4   Title Transverse abdominals activation and core strength to 4-/5 needed for standing and walking for longer periods of time in the community and at work.    Time 8   Status On-going   PT LONG TERM GOAL #5   Title Patient will have adequate spinal and hip ROM needed to lift a 10# object from knee level with proper body mechanics.     Time 8   Period Weeks   Status Unable to assess               Plan - 01/24/15 0846    Clinical Impression Statement No increased  pain.    PT Next Visit Plan review wall slides, calf stretch, continue core in supine and standing, standing theraband for postural strength?        Problem List Patient Active Problem List   Diagnosis Date Noted  . Aftercare following surgery of the circulatory system 11/10/2014  . Drainage from wound 11/10/2014  . Lower abdominal pain 11/10/2014  . AAA (abdominal aortic aneurysm) without rupture 07/25/2014  . AAA (abdominal aortic aneurysm) 05/03/2014  . Abdominal aneurysm without mention of rupture 04/25/2014  . Severe obesity (BMI >= 40) 04/17/2014  . Hyperlipidemia 10/22/2011  . Elevated blood pressure 10/22/2011  . Hidradenitis suppurativa 10/22/2011    Hessie Diener [facility] 01/24/2015, 8:47 AM  [facility] [address] [place], [place], [address] Phone: [phone]   Fax:  [phone]

## 2015-01-24 NOTE — Patient Instructions (Signed)
External Rotation: Hip - Knees Apart (Side-Lying)   Keep abdominals drawn in. Lie on left side with hips and knees slightly bent, band tied just above knees. Pull knees apart. Hold for _5__ seconds.. Repeat _20__ times. Do _2__ times a day.   Copyright  VHI. All rights reserved.

## 2015-01-29 ENCOUNTER — Ambulatory Visit: Payer: PRIVATE HEALTH INSURANCE | Admitting: Physical Therapy

## 2015-01-29 DIAGNOSIS — M545 Low back pain, unspecified: Secondary | ICD-10-CM

## 2015-01-29 DIAGNOSIS — Z5189 Encounter for other specified aftercare: Secondary | ICD-10-CM | POA: Diagnosis not present

## 2015-01-29 NOTE — Therapy (Signed)
[place], [place], [address] Phone: [phone]   Fax:  [phone]  Physical Therapy Treatment  Patient Details  Name: Julie Holder MRN: [id number] Date of Birth: [birth year] Referring Provider:  Eulas Post, MD  Encounter Date: 01/29/2015      PT End of Session - 01/29/15 0843    Visit Number 7   Number of Visits 16   Date for PT Re-Evaluation 02/23/15   PT Start Time 0802   PT Stop Time 0858   PT Time Calculation (min) 56 min   Activity Tolerance Patient tolerated treatment well;Patient limited by pain      Past Medical History  Diagnosis Date  . Chicken pox   . Hypertension   . Hyperlipidemia   . Hidradenitis suppurativa   . Anemia     Past Surgical History  Procedure Laterality Date  . Hand surgery  1975  . Colonoscopy w/ biopsies and polypectomy    . Abdominal aortic aneurysm repair N/A 05/03/2014    Procedure: RESECTION AND GRAFTING ANEURYSM ABDOMINAL AORTIC WITH BILATERAL COMMON ILIAC GRAFTING USING 18X9 HEMASHIELD GRAFT;  Surgeon: Mal Misty, MD;  Location: [facility];  Service: Vascular;  Laterality: N/A;    LMP 05/31/2011  Visit Diagnosis:  Bilateral low back pain without sciatica      Subjective Assessment - 01/29/15 0806    Symptoms 4/10.  Has a new pain starts Lt posterior thigh and shoots to ambominal ares quick little shooting pain can happen laying down happened 3 times in a row, may be better with change of position.   Currently in Pain? Yes   Pain Score 4    Pain Location Back   Pain Orientation Right;Left;Lower   Pain Descriptors / Indicators Aching  lingering pain   Pain Frequency Constant   Aggravating Factors  standing, walking too long, bending over (knows how to do)   Pain Relieving Factors using good techniques with getting clothes out of the dryer   Effect of Pain on Daily Activities limits walking and sitting   Multiple Pain Sites No                     [facility] Adult PT Treatment/Exercise - 01/29/15 0810    Lumbar Exercises: Stretches   Lower Trunk Rotation 5 reps  5 seconds   Lumbar Exercises: Aerobic   Stationary Bike Nustep level 4 6.5 minutes   Lumbar Exercises: Standing   Wall Slides 10 reps   Scapular Retraction Both;10 reps;Theraband  yellow, 10 reps  also diagonals 10   Other Standing Lumbar Exercises Hip hinge, small motions.  Pain already flared after gasctoc stretches on step   Knee/Hip Exercises: Stretches   Gastroc Stretch 3 reps;30 seconds  increased back pain  not sure why   Moist Heat Therapy   Number Minutes Moist Heat 15 Minutes   Moist Heat Location --  Back   Electrical Stimulation   Electrical Stimulation Location Back   Electrical Stimulation Action IFC   Electrical Stimulation Parameters 12   Electrical Stimulation Goals Pain                  PT Short Term Goals - 01/29/15 6378    PT SHORT TERM GOAL #1   Title "Independent with initial HEP   Status Achieved   PT SHORT TERM GOAL #2   Title The patient will demonstrate improved body mechanics, including hip hingeing with lifting light objects 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    Time 8   Period Weeks   Status On-going   PT LONG TERM GOAL #3   Title Modified Oswestry score improved from 42% to 30%  indicating improved function with less pain   Time 8   Period Weeks   Status Unable to assess   PT LONG TERM GOAL #4   Title Transverse abdominals activation and core strength to 4-/5 needed for standing and walking for longer periods of time in the community and at work.    Time 8   Period Weeks   Status On-going   PT LONG TERM GOAL #5   Title Patient will have adequate spinal and hip ROM needed to lift a 10# object from knee level with proper body mechanics.     Time 8   Period Weeks   Status Unable to assess               Plan - 01/29/15 1751    Clinical Impression Statement pain increased with gastroc stretches.  Able to begin hip hinging   PT Next Visit Plan try runner's stretch vs 2 on step.  Ask about yoga walking.  Core        Problem List Patient Active Problem List   Diagnosis Date Noted  . Aftercare following surgery of the circulatory system 11/10/2014  . Drainage from wound 11/10/2014  . Lower abdominal pain 11/10/2014  . AAA (abdominal aortic aneurysm) without rupture 07/25/2014  . AAA (abdominal aortic aneurysm) 05/03/2014  . Abdominal aneurysm without mention of rupture 04/25/2014  . Severe obesity (BMI >= 40) 04/17/2014  . Hyperlipidemia 10/22/2011  . Elevated blood pressure 10/22/2011  . Hidradenitis suppurativa 10/22/2011   Melvenia Needles, PTA 01/29/2015 8:46 AM Phone: [phone] Fax: [phone]  [facility] 01/29/2015, 8:46 AM  [facility] [address] [place], [place], [address] Phone: [phone]   Fax:  [phone]

## 2015-01-29 NOTE — Patient Instructions (Signed)
Keep up home walking program.  Stop when form fails. Yoga walking. Trial.

## 2015-01-31 ENCOUNTER — Ambulatory Visit: Payer: PRIVATE HEALTH INSURANCE | Admitting: Rehabilitation

## 2015-01-31 DIAGNOSIS — M545 Low back pain, unspecified: Secondary | ICD-10-CM

## 2015-01-31 DIAGNOSIS — Z5189 Encounter for other specified aftercare: Secondary | ICD-10-CM | POA: Diagnosis not present

## 2015-01-31 NOTE — Therapy (Signed)
[place], [place], [address] Phone: [phone]   Fax:  [phone]  Physical Therapy Treatment  Patient Details  Name: Julie Holder MRN: [id number] Date of Birth: 16-Jul-1961 Referring Provider:  Eulas Post, MD  Encounter Date: 01/31/2015      PT End of Session - 01/31/15 0832    Visit Number 8   Number of Visits 16   Date for PT Re-Evaluation 02/23/15   PT Start Time 0802   PT Stop Time 0845   PT Time Calculation (min) 43 min      Past Medical History  Diagnosis Date  . Chicken pox   . Hypertension   . Hyperlipidemia   . Hidradenitis suppurativa   . Anemia     Past Surgical History  Procedure Laterality Date  . Hand surgery  1975  . Colonoscopy w/ biopsies and polypectomy    . Abdominal aortic aneurysm repair N/A 05/03/2014    Procedure: RESECTION AND GRAFTING ANEURYSM ABDOMINAL AORTIC WITH BILATERAL COMMON ILIAC GRAFTING USING 18X9 HEMASHIELD GRAFT;  Surgeon: Mal Misty, MD;  Location: [facility];  Service: Vascular;  Laterality: N/A;    LMP 05/31/2011  Visit Diagnosis:  Bilateral low back pain without sciatica      Subjective Assessment - 01/31/15 0806    Symptoms Shooting pain from thigh to abdominals has stopped. LBP 4/10 this morning. 7/10 last night. Could not get comfortable.                     [place] Adult PT Treatment/Exercise - 01/31/15 0812    Lumbar Exercises: Aerobic   Stationary Bike Nustep L 5 10 min UE/LE   Lumbar Exercises: Standing   Wall Slides 10 reps   Row Strengthening;Both;15 reps   Theraband Level (Row) Level 1 (Yellow)   Shoulder Extension Strengthening;Both;15 reps;Theraband   Theraband Level (Shoulder Extension) Level 1 (Yellow)   Other Standing Lumbar Exercises SLS with 3 way vectors 3# on ankles focusing on core stability 10 x 2 each with and without UE support   Other Standing Lumbar Exercises Hip Hinge   Lumbar Exercises: Supine    Ab Set 10 reps   Bent Knee Raise 10 reps   Bent Knee Raise Limitations Scissors level 1 and Level 2    Bridge 10 reps  also bridge with 3 clams and green band x 10   Lumbar Exercises: Sidelying   Clam 20 reps  green band   Knee/Hip Exercises: Stretches   Gastroc Stretch 3 reps;30 seconds  lunge                PT Education - 01/31/15 0843    Education provided Yes   Education Details HEP   Person(s) Educated Patient   Methods Explanation;Handout   Comprehension Verbalized understanding          PT Short Term Goals - 01/29/15 0823    PT SHORT TERM GOAL #1   Title "Independent with initial HEP   Status Achieved   PT SHORT TERM GOAL #2   Title The patient will demonstrate improved body mechanics, including hip hingeing with lifting light objects 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    Time 8   Period Weeks   Status On-going   PT LONG TERM GOAL #3   Title Modified Oswestry score improved from 42% to 30% indicating improved function with less pain   Time 8   Period Weeks   Status Unable to assess   PT LONG TERM GOAL #4   Title Transverse abdominals activation and core strength to 4-/5 needed for standing and walking for longer periods of time in the community and at work.    Time 8   Period Weeks   Status On-going   PT LONG TERM GOAL #5   Title  Patient will have adequate spinal and hip ROM needed to lift a 10# object from knee level with proper body mechanics.     Time 8   Period Weeks   Status Unable to assess               Plan - 01/31/15 6381    Clinical Impression Statement Pt tolerated 20 minutes  standing therex without increased pain. No increased pain with runners gastroc stretch. Declined modalities.    PT Next Visit Plan cont supine and standing core strength, postural strength, CHECK GOALS        Problem List Patient Active Problem List   Diagnosis Date Noted  . Aftercare following surgery of the circulatory system 11/10/2014  . Drainage from wound 11/10/2014  . Lower abdominal pain 11/10/2014  . AAA (abdominal aortic aneurysm) without rupture 07/25/2014  . AAA (abdominal aortic aneurysm) 05/03/2014  . Abdominal aneurysm without mention of rupture 04/25/2014  . Severe obesity (BMI >= 40) 04/17/2014  . Hyperlipidemia 10/22/2011  . Elevated blood pressure 10/22/2011  . Hidradenitis suppurativa 10/22/2011    Dorene Ar, PTA 01/31/2015, 10:55 AM  [place], [place], [address] Phone: [phone]   Fax:  [phone]

## 2015-01-31 NOTE — Patient Instructions (Signed)
EXTENSION: Standing - Resistance Band: Stable (Active)   Stand, right arm at side. Against yellow resistance band, draw arm backward, as far as possible, keeping elbow straight. Complete _2__ sets of __10_ repetitions. Perform __2_ sessions per day.     Copyright  VHI. All rights reserved.  Resistive Band Rowing   With resistive band anchored in door, grasp both ends. Keeping elbows bent, pull back, squeezing shoulder blades together. Hold _5___ seconds. Repeat _10___ times. Do 2 sets . Do __2__ sessions per day.  http://[email or web address].exer.[email or web address]   Copyright  VHI. All rights reserved.

## 2015-02-12 ENCOUNTER — Encounter: Payer: Self-pay | Admitting: Physical Therapy

## 2015-02-13 ENCOUNTER — Ambulatory Visit: Payer: PRIVATE HEALTH INSURANCE | Admitting: Physical Therapy

## 2015-02-13 DIAGNOSIS — Z5189 Encounter for other specified aftercare: Secondary | ICD-10-CM | POA: Diagnosis not present

## 2015-02-13 DIAGNOSIS — M545 Low back pain, unspecified: Secondary | ICD-10-CM

## 2015-02-13 NOTE — Therapy (Signed)
[place], [place], [address] Phone: [phone]   Fax:  [phone]  Physical Therapy Treatment  Patient Details  Name: Julie Holder MRN: [id number] Date of Birth: February 20, 1961 Referring Provider:  Cindee Salt, MD  Encounter Date: 02/13/2015      PT End of Session - 02/13/15 1052    Visit Number 9   Number of Visits 16   Date for PT Re-Evaluation 02/23/15   PT Start Time 5056   PT Stop Time 1100   PT Time Calculation (min) 45 min      Past Medical History  Diagnosis Date  . Chicken pox   . Hypertension   . Hyperlipidemia   . Hidradenitis suppurativa   . Anemia     Past Surgical History  Procedure Laterality Date  . Hand surgery  1975  . Colonoscopy w/ biopsies and polypectomy    . Abdominal aortic aneurysm repair N/A 05/03/2014    Procedure: RESECTION AND GRAFTING ANEURYSM ABDOMINAL AORTIC WITH BILATERAL COMMON ILIAC GRAFTING USING 18X9 HEMASHIELD GRAFT;  Surgeon: Mal Misty, MD;  Location: [facility] OR;  Service: Vascular;  Laterality: N/A;    LMP 05/31/2011  Visit Diagnosis:  Bilateral low back pain without sciatica      Subjective Assessment - 02/13/15 1014    Symptoms Dr. Nelva Bush wants therapy notes.  States certain exercises help.  Feels she is making progress with PT.    Reports she is waiting for her home TENS unit to arrive that she ordered a few weeks ago.   Still unable to work in the kitchen.  Dr. Nelva Bush gave me a patch but waiting for instructions on use.  Less sharp pains in knee/less buckling.   Pertinent History AAA surgery in June 50# lift limit   How long can you walk comfortably? 15 minutes   Currently in Pain? Yes   Pain Score 5    Pain Location Back   Pain Orientation Right;Left;Lower   Pain Type Chronic pain   Aggravating Factors  standing in the shower, bending to put on socks   Pain Relieving Factors lumbar rotation, sit with lumbar roll, bridges           [facility] PT Assessment - 02/13/15 1024    Observation/Other Assessments   Oswestry Disability Index  was 42%, now 50% worsening may be because patient was taken out of work and on lifting limit                  [facility] Adult PT Treatment/Exercise - 02/13/15 1029    Lumbar Exercises: Aerobic   Stationary Bike Nustep L 5 10 min UE/LE   Lumbar Exercises: Standing   Row Strengthening;Both;Theraband;15 reps   Theraband Level (Row) Level 2 (Red)   Shoulder Extension Strengthening;Both;15 reps;Theraband   Theraband Level (Shoulder Extension) Level 2 (Red)   Other Standing Lumbar Exercises sit to stand no UEs 10x   Other Standing Lumbar Exercises Wall planks 10x   Lumbar Exercises: Supine   Ab Set 10 reps   Bent Knee Raise 10 reps   Bridge 10 reps   Isometric Hip Flexion 5 reps  with abdominal brace   Lumbar Exercises: Sidelying   Clam 15 reps  green band right/left                  PT Short Term Goals - 02/13/15 1049    PT SHORT TERM GOAL #1   Title "Independent with initial HEP  Status Achieved   PT SHORT TERM GOAL #2   Title The patient will demonstrate improved body mechanics, including hip hingeing with lifting light objects 5# 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    Time 8   Period Weeks   Status On-going   PT LONG TERM GOAL #3   Title  Modified Oswestry score improved from 42% to 30% indicating improved function with less pain   Time 8   Period Weeks   Status On-going   PT LONG TERM GOAL #4   Title Transverse abdominals activation and core strength to 4-/5 needed for standing and walking for longer periods of time in the community and at work.    Time 8   Period Weeks   Status On-going   PT LONG TERM GOAL #5   Title Patient will have adequate spinal and hip ROM needed to lift a 10# object from knee level with proper body mechanics.     Time 8   Period Weeks   Status On-going               Plan - 02/13/15 1100    Clinical Impression Statement Continue with treatment plan for 2 more weeks for further progress toward goals and determination at that time for additional PT vs. discharge if independent in HEP.  Patient states her pain is 4/10 after 45 min of exercise.  Declines the need for modalities, stating she will put heat on at home.  Able to increase difficulty of exercises slightly without an exacerbation of symptoms.  No LE symptoms.  No improvement in Oswestry functional outcome score but may be secondary to out of work and lifting limitations.  Patient reports subjective improvements with PT.  Therapist closely monitoring pain throughout session and verbal cueing for postural alignment.        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    Problem List Patient Active Problem List   Diagnosis Date Noted  . Aftercare following surgery of the circulatory system 11/10/2014  . Drainage from wound 11/10/2014  . Lower abdominal pain 11/10/2014  . AAA (abdominal aortic aneurysm) without rupture 07/25/2014  . AAA (abdominal aortic aneurysm) 05/03/2014  . Abdominal aneurysm without mention of rupture 04/25/2014  . Severe obesity (BMI >= 40) 04/17/2014  . Hyperlipidemia 10/22/2011  . Elevated blood pressure 10/22/2011  .  Hidradenitis suppurativa 10/22/2011    Alvera Singh 02/13/2015, 3:21 PM  [facility] [address] [place], [place], [address] Phone: [phone]   Fax:  [phone]    Ruben Im, PT 02/13/2015 3:22 PM Phone: [phone] Fax: [phone]

## 2015-02-14 ENCOUNTER — Ambulatory Visit: Payer: PRIVATE HEALTH INSURANCE | Admitting: Rehabilitation

## 2015-02-14 DIAGNOSIS — M545 Low back pain, unspecified: Secondary | ICD-10-CM

## 2015-02-14 DIAGNOSIS — Z5189 Encounter for other specified aftercare: Secondary | ICD-10-CM | POA: Diagnosis not present

## 2015-02-14 NOTE — Therapy (Signed)
[place], [place], [address] Phone: [phone]   Fax:  [phone]  Physical Therapy Treatment  Patient Details  Name: Julie Holder MRN: [id number] Date of Birth: 23-Jan-1961 Referring Provider:  Eulas Post, MD  Encounter Date: 02/14/2015      PT End of Session - 02/14/15 1047    Visit Number 10   Number of Visits 16   Date for PT Re-Evaluation 02/23/15   PT Start Time 2637   PT Stop Time 1136   PT Time Calculation (min) 49 min      Past Medical History  Diagnosis Date  . Chicken pox   . Hypertension   . Hyperlipidemia   . Hidradenitis suppurativa   . Anemia     Past Surgical History  Procedure Laterality Date  . Hand surgery  1975  . Colonoscopy w/ biopsies and polypectomy    . Abdominal aortic aneurysm repair N/A 05/03/2014    Procedure: RESECTION AND GRAFTING ANEURYSM ABDOMINAL AORTIC WITH BILATERAL COMMON ILIAC GRAFTING USING 18X9 HEMASHIELD GRAFT;  Surgeon: Mal Misty, MD;  Location: [facility] OR;  Service: Vascular;  Laterality: N/A;    LMP 05/31/2011  Visit Diagnosis:  Bilateral low back pain without sciatica      Subjective Assessment - 02/14/15 1048    Symptoms I may have over done it yesterday. I had P.T. then did alot of walking in grocerry store and to the  credit union inside the hospital. 4-5/10 pain now. Wore a pain patch over night.           [facility] PT Assessment - 02/14/15 0001    Strength   Right Hip ABduction 4+/5   Left Hip ABduction 4+/5                  [facility] Adult PT Treatment/Exercise - 02/14/15 1050    Lumbar Exercises: Aerobic   Stationary Bike Nustep L 5 10 min UE/LE   Lumbar Exercises: Standing   Heel Raises 20 reps   Wall Slides 15 reps   Other Standing Lumbar Exercises SLS with 3 way vectors 3# on ankles focusing on core stability 10 x 2 each with and without UE support   Other Standing Lumbar Exercises Wall planks 10x   Lumbar  Exercises: Seated   Other Seated Lumbar Exercises seated childs pose using rolling stool forward and lateral x 3 each. Pt reports she is unable to perform supine knee to chest stretches due to recent abdominal surgery..   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    Time 8   Period Weeks   Status On-going   PT LONG TERM GOAL #3   Title Modified Oswestry score improved from 42% to 30% indicating improved function with less pain   Time 8   Period Weeks   Status On-going   PT LONG TERM GOAL #4   Title Transverse abdominals activation and core strength to 4-/5 needed for standing and walking for longer periods of time in the community and at work.    Time 8   Period Weeks   Status On-going   PT LONG TERM GOAL #5   Title Patient will have adequate spinal and hip ROM needed to lift a 10# object from knee level with proper body mechanics.      Time 8   Period Weeks   Status On-going               Plan - 02/14/15 1123    Clinical Impression Statement Pt reports Pain increase after P.T. and prolonged standing and walking for errands yesterday. Able to re-establish lumbar flexion stretching using seated childs pose with rolling stool for more comfort in adominals due to abdominal surgery last year.  Hip abduction strength improved to 4+/5  bilateral. Continued weakness in abdominals due to previous surgery.    PT Next Visit Plan Continue with core strengthening; LE strengthening; functional strengthening, 2 more visits and DC        Problem List Patient Active Problem List   Diagnosis Date Noted  . Aftercare following surgery of the circulatory system 11/10/2014  . Drainage from wound 11/10/2014  . Lower abdominal pain 11/10/2014  . AAA (abdominal aortic aneurysm) without rupture 07/25/2014  . AAA (abdominal aortic aneurysm) 05/03/2014  . Abdominal aneurysm without mention of rupture 04/25/2014  . Severe obesity (BMI >= 40) 04/17/2014  . Hyperlipidemia 10/22/2011  . Elevated blood pressure 10/22/2011  . Hidradenitis suppurativa 10/22/2011    Dorene Ar, PTA 02/14/2015, 11:39 AM  [place], [place], [address] Phone: [phone]   Fax:  [phone]

## 2015-02-19 ENCOUNTER — Ambulatory Visit: Payer: PRIVATE HEALTH INSURANCE | Admitting: Physical Therapy

## 2015-02-19 DIAGNOSIS — Z5189 Encounter for other specified aftercare: Secondary | ICD-10-CM | POA: Diagnosis not present

## 2015-02-19 DIAGNOSIS — M545 Low back pain, unspecified: Secondary | ICD-10-CM

## 2015-02-19 NOTE — Therapy (Signed)
[place], [place], [address] Phone: [phone]   Fax:  [phone]  Physical Therapy Treatment  Patient Details  Name: Julie Holder MRN: [id number] Date of Birth: 09-01-1961 Referring Provider:  Eulas Post, MD  Encounter Date: 02/19/2015      PT End of Session - 02/19/15 1719    PT Start Time 0801   PT Stop Time 0845   PT Time Calculation (min) 44 min   Activity Tolerance Patient tolerated treatment well;Patient limited by pain;Patient limited by fatigue      Past Medical History  Diagnosis Date  . Chicken pox   . Hypertension   . Hyperlipidemia   . Hidradenitis suppurativa   . Anemia     Past Surgical History  Procedure Laterality Date  . Hand surgery  1975  . Colonoscopy w/ biopsies and polypectomy    . Abdominal aortic aneurysm repair N/A 05/03/2014    Procedure: RESECTION AND GRAFTING ANEURYSM ABDOMINAL AORTIC WITH BILATERAL COMMON ILIAC GRAFTING USING 18X9 HEMASHIELD GRAFT;  Surgeon: Mal Misty, MD;  Location: [facility] OR;  Service: Vascular;  Laterality: N/A;    LMP 05/31/2011  Visit Diagnosis:  Bilateral low back pain without sciatica      Subjective Assessment - 02/19/15 0818    Currently in Pain? Yes   Pain Score 4   gets up to 7/10   Pain Location Back   Pain Orientation Lower   Pain Descriptors / Indicators Aching   Pain Frequency Constant   Aggravating Factors  activity,   sitting without low back support, bending to  wash feet in shower.   Pain Relieving Factors low back support, foot on stool for donning shoes, medication patch every other day,  E- stim helpful   Effect of Pain on Daily Activities painful reaching to wash in shower, limits activity, not working   Multiple Pain Sites No                    [facility] Adult PT Treatment/Exercise - 02/19/15 0813    Posture/Postural Control   Posture Comments able to lift 10 LBS off knee high mat 3 times no  increased pain.    Lumbar Exercises: Stretches   Lower Trunk Rotation 5 reps   Lumbar Exercises: Aerobic   Stationary Bike Nu step   Lumbar Exercises: Supine   Ab Set 10 reps  cues   Heel Slides Limitations 10 cues   Bent Knee Raise 10 reps   Bridge 10 reps  2sets, one with green band   Lumbar Exercises: Sidelying   Clam --  green band. 15 reps   Other Sidelying Lumbar Exercises attempted side plank from knees, unable                  PT Short Term Goals - 02/19/15 1722    PT SHORT TERM GOAL #3   Status Unable to assess           PT Long Term Goals - 02/19/15 0818    PT LONG TERM GOAL #5   Title Patient will have adequate spinal and hip ROM needed to lift a 10# object from knee level with proper body mechanics.     Time 8   Period Weeks   Status Achieved               Plan - 02/19/15 1720    Clinical Impression Statement Strengthening focus.  Patient feels she is not ready  to go back to work and lift heavy items from overhead or lower.   PT Next Visit Plan Continue with core strengthening; LE strengthening; functional strengthening,  Patient does not want discharge so asked her to schedule an ERO        Problem List Patient Active Problem List   Diagnosis Date Noted  . Aftercare following surgery of the circulatory system 11/10/2014  . Drainage from wound 11/10/2014  . Lower abdominal pain 11/10/2014  . AAA (abdominal aortic aneurysm) without rupture 07/25/2014  . AAA (abdominal aortic aneurysm) 05/03/2014  . Abdominal aneurysm without mention of rupture 04/25/2014  . Severe obesity (BMI >= 40) 04/17/2014  . Hyperlipidemia 10/22/2011  . Elevated blood pressure 10/22/2011  . Hidradenitis suppurativa 10/22/2011  Melvenia Needles, PTA 02/19/2015 5:24 PM Phone: [phone] Fax: [phone]   [facility] 02/19/2015, 5:24 PM  [facility] [address] [place], [place], [address] Phone:  [phone]   Fax:  [phone]

## 2015-02-21 ENCOUNTER — Ambulatory Visit: Payer: PRIVATE HEALTH INSURANCE | Attending: Family Medicine | Admitting: Rehabilitation

## 2015-02-21 DIAGNOSIS — M545 Low back pain, unspecified: Secondary | ICD-10-CM

## 2015-02-21 NOTE — Therapy (Signed)
[place], [place], [address] Phone: [phone]   Fax:  [phone]  Physical Therapy Treatment  Patient Details  Name: Julie Holder MRN: [id number] Date of Birth: [birth year] Referring Provider:  Eulas Post, MD  Encounter Date: 02/21/2015      PT End of Session - 02/21/15 1155    Visit Number 12   Number of Visits 16   Date for PT Re-Evaluation 02/22/13   PT Start Time 0800   PT Stop Time 0900   PT Time Calculation (min) 60 min      Past Medical History  Diagnosis Date  . Chicken pox   . Hypertension   . Hyperlipidemia   . Hidradenitis suppurativa   . Anemia     Past Surgical History  Procedure Laterality Date  . Hand surgery  1975  . Colonoscopy w/ biopsies and polypectomy    . Abdominal aortic aneurysm repair N/A 05/03/2014    Procedure: RESECTION AND GRAFTING ANEURYSM ABDOMINAL AORTIC WITH BILATERAL COMMON ILIAC GRAFTING USING 18X9 HEMASHIELD GRAFT;  Surgeon: Mal Misty, MD;  Location: [place];  Service: Vascular;  Laterality: N/A;    LMP 05/31/2011  Visit Diagnosis:  Bilateral low back pain without sciatica      Subjective Assessment - 02/21/15 0818    Symptoms 5/10 across lower back, achey. It woke me up out of my sleep last night.                    [place] Adult PT Treatment/Exercise - 02/21/15 0001    Lumbar Exercises: Aerobic   Stationary Bike Nu step Level 6 x 10 min   Lumbar Exercises: Standing   Row Strengthening;Power tower;Both;15 reps   Theraband Level (Row) --  2plates   Shoulder Extension Strengthening;Power Tower;Both;10 reps;Other (comment)  2 plates.   Other Standing Lumbar Exercises Free Motion: Bicep curls x 15 tricep ext x 15 bilateral punch x 1, Row/Punch combo x 10 each way all with cues for abdominal bracing and posture.    Other Standing Lumbar Exercises Standing rotational stabilization with red band in one UE pulling to the other  with arms at 90 degrees. 10 pulls each way progressing from bilateral stance to staggered stance to SLS x 10 each   Moist Heat Therapy   Number Minutes Moist Heat 15 Minutes   Moist Heat Location --  Back   Electrical Stimulation   Electrical Stimulation Location Back   Electrical Stimulation Action IFC   Electrical Stimulation Parameters to tolerance   Electrical Stimulation Goals Pain                  PT Short Term Goals - 02/21/15 1159    PT SHORT TERM GOAL #1   Title "Independent with initial HEP   Time 4   Period Weeks   Status Achieved   PT SHORT TERM GOAL #2   Title The patient will demonstrate improved body mechanics, including hip hingeing with 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    Time 8   Period Weeks   Status On-going   PT LONG TERM GOAL #3   Title Modified Oswestry score improved from 42% to 30% indicating improved function with less pain   Time 8   Period Weeks   Status On-going   PT LONG TERM GOAL #4   Title Transverse abdominals activation and core strength to 4-/5 needed for standing and walking for longer periods of time in the community and at work.    Time 8   Period Weeks   Status On-going   PT LONG TERM GOAL #5   Title Patient will have adequate spinal and hip ROM needed to lift a 10# object from knee level with  proper body mechanics.     Time 8   Period Weeks   Status Achieved               Plan - 02/21/15 1155    Clinical Impression Statement Pt is concerned about her ability to return to work and lift 10-25 lbs. She would like to continue P.T. Pt tolerated standing core/ UE strengthening today with mild increase in pain.    PT Next Visit Plan See P.T. to discuss DC vs ERO         Problem List Patient Active Problem List   Diagnosis Date Noted  . Aftercare following surgery of the circulatory system 11/10/2014  . Drainage from wound 11/10/2014  . Lower abdominal pain 11/10/2014  . AAA (abdominal aortic aneurysm) without rupture 07/25/2014  . AAA (abdominal aortic aneurysm) 05/03/2014  . Abdominal aneurysm without mention of rupture 04/25/2014  . Severe obesity (BMI >= 40) 04/17/2014  . Hyperlipidemia 10/22/2011  . Elevated blood pressure 10/22/2011  . Hidradenitis suppurativa 10/22/2011    Dorene Ar, PTA 02/21/2015, 12:00 PM  [place], [place], [address] Phone: [phone]   Fax:  [phone]

## 2015-02-24 ENCOUNTER — Encounter (HOSPITAL_COMMUNITY): Payer: Self-pay

## 2015-02-24 ENCOUNTER — Emergency Department (HOSPITAL_COMMUNITY)
Admission: EM | Admit: 2015-02-24 | Discharge: 2015-02-24 | Disposition: A | Discharge status: Home / Self Care | Payer: 59 | Source: Home / Self Care | Attending: Family Medicine | Admitting: Family Medicine

## 2015-02-24 DIAGNOSIS — H109 Unspecified conjunctivitis: Secondary | ICD-10-CM

## 2015-02-24 MED ORDER — POLYMYXIN B-TRIMETHOPRIM 10000-0.1 UNIT/ML-% OP SOLN: 1.0000 [drp] | OPHTHALMIC | Status: DC

## 2015-02-24 NOTE — ED Provider Notes (Signed)
CSN: [id number]     Arrival date & time 02/24/15  1018 History   First MD Initiated Contact with Patient 02/24/15 1124     Chief Complaint  Patient presents with  . Conjunctivitis   (Consider location/radiation/quality/duration/timing/severity/associated sxs/prior Treatment) HPI Comments: 54 year old female noticed redness to the right eye about 2 days ago. She says also draining and itching. She denies problems with vision.   Past Medical History  Diagnosis Date  . Chicken pox   . Hypertension   . Hyperlipidemia   . Hidradenitis suppurativa   . Anemia    Past Surgical History  Procedure Laterality Date  . Hand surgery  1975  . Colonoscopy w/ biopsies and polypectomy    . Abdominal aortic aneurysm repair N/A 05/03/2014    Procedure: RESECTION AND GRAFTING ANEURYSM ABDOMINAL AORTIC WITH BILATERAL COMMON ILIAC GRAFTING USING 18X9 HEMASHIELD GRAFT;  Surgeon: Mal Misty, MD;  Location: [facility] OR;  Service: Vascular;  Laterality: N/A;   Family History  Problem Relation Age of Onset  . Heart disease Mother     CHF  . Hyperlipidemia Mother   . Kidney disease Mother   . Diabetes Mother   . Hypertension Mother   . Hyperlipidemia Sister   . Hypertension Sister   . Hyperlipidemia Brother   . Diabetes Brother   . Hypertension Brother   . Colon cancer Neg Hx   . Cancer Brother   . Cancer Father   . Hypertension Father   . Hyperlipidemia Father   . Hyperlipidemia Son    History  Substance Use Topics  . Smoking status: Former Smoker -- 0.30 packs/day for 30 years    Types: Cigarettes    Quit date: 01/21/2009  . Smokeless tobacco: Never Used  . Alcohol Use: No   OB History    No data available     Review of Systems  Constitutional: Negative.   Eyes: Positive for discharge, redness and itching. Negative for visual disturbance.  All other systems reviewed and are negative.   Allergies  Amoxicillin; Fish allergy; Claravis; Penicillins; Statins; Aspirin; and Tape  Home  Medications   Prior to Admission medications   Medication Sig Start Date End Date Taking? Authorizing Provider  cholecalciferol (VITAMIN D) 400 UNITS TABS Take 400 Units by mouth daily.    Historical Provider, MD  Coenzyme Q10 (COQ10) 100 MG CAPS Take 100 mg by mouth 2 (two) times daily.    Historical Provider, MD  docusate sodium (COLACE) 100 MG capsule Take 100 mg by mouth 2 (two) times daily.    Historical Provider, MD  etodolac (LODINE) 400 MG tablet as needed. 10/11/14   Historical Provider, MD  lisinopril-hydrochlorothiazide (PRINZIDE,ZESTORETIC) 10-12.5 MG per tablet Take 1 tablet by mouth daily. 11/20/14   Eulas Post, MD  methocarbamol (ROBAXIN) 500 MG tablet  10/11/14   Historical Provider, MD  Omega-3 Fatty Acids (FISH OIL) 1200 MG CAPS Take 1,200 mg by mouth 2 (two) times daily.    Historical Provider, MD  pravastatin (PRAVACHOL) 40 MG tablet Take 20 mg by mouth 2 (two) times daily.    Historical Provider, MD  Red Yeast Rice 600 MG CAPS Take 600 mg by mouth 2 (two) times daily.    Historical Provider, MD  trimethoprim-polymyxin b (POLYTRIM) ophthalmic solution Place 1 drop into the right eye every 4 (four) hours. For 5 d 02/24/15   Janne Napoleon, NP   BP 126/70 mmHg  Pulse 70  Temp(Src) 98 F (36.7 C) (Oral)  Resp 20  SpO2 100%  LMP 05/31/2011 Physical Exam  Constitutional: She appears well-developed and well-nourished. No distress.  Eyes: EOM are normal. Pupils are equal, round, and reactive to light.  Right eye conjunctiva is erythematous. Mild pinkish color to the sclera. There is evidence of a colored dry drainage beneath her eye. The upper eyelid was everted no foreign body seen.  Cardiovascular: Normal rate.   Neurological: She is alert.  Skin: Skin is warm and dry.  Psychiatric: She has a normal mood and affect.  Nursing note and vitals reviewed.   ED Course  Procedures (including critical care time) Labs Review Labs Reviewed - No data to display  Imaging  Review No results found.   MDM   1. Conjunctivitis of right eye    Zaditor eye drops for discomfort , swelling and redness Polytrim gtts as dir Warm compresses    Janne Napoleon, NP 02/24/15 1155

## 2015-02-24 NOTE — ED Notes (Signed)
C/o no relief of eye redness when she used pinkeye OTC remedy.

## 2015-02-24 NOTE — Discharge Instructions (Signed)
Conjunctivitis Zaditor eye drops for discomfort , swelling and redness Conjunctivitis is commonly called "pink eye." Conjunctivitis can be caused by bacterial or viral infection, allergies, or injuries. There is usually redness of the lining of the eye, itching, discomfort, and sometimes discharge. There may be deposits of matter along the eyelids. A viral infection usually causes a watery discharge, while a bacterial infection causes a yellowish, thick discharge. Pink eye is very contagious and spreads by direct contact. You may be given antibiotic eyedrops as part of your treatment. Before using your eye medicine, remove all drainage from the eye by washing gently with warm water and cotton balls. Continue to use the medication until you have awakened 2 mornings in a row without discharge from the eye. Do not rub your eye. This increases the irritation and helps spread infection. Use separate towels from other household members. Wash your hands with soap and water before and after touching your eyes. Use cold compresses to reduce pain and sunglasses to relieve irritation from light. Do not wear contact lenses or wear eye makeup until the infection is gone. SEEK MEDICAL CARE IF:   Your symptoms are not better after 3 days of treatment.  You have increased pain or trouble seeing.  The outer eyelids become very red or swollen. Document Released: 01/15/2005 Document Revised: 03/01/2012 Document Reviewed: 12/08/2005 [facility] Patient Information 2015 [place], [place]. This information is not intended to replace advice given to you by your health care provider. Make sure you discuss any questions you have with your health care provider.

## 2015-02-27 ENCOUNTER — Ambulatory Visit: Payer: PRIVATE HEALTH INSURANCE | Admitting: Physical Therapy

## 2015-02-27 DIAGNOSIS — M545 Low back pain, unspecified: Secondary | ICD-10-CM

## 2015-02-27 NOTE — Therapy (Signed)
[place], [place], [address] Phone: [phone]   Fax:  [phone]  Physical Therapy Treatment  Patient Details  Name: Julie Holder MRN: [id number] Date of Birth: 05-11-1961 Referring Provider:  Eulas Post, MD  Encounter Date: 02/27/2015      PT End of Session - 02/27/15 1246    Visit Number 13   Number of Visits 20   Date for PT Re-Evaluation 04/10/15   PT Start Time 2778   PT Stop Time 1300   PT Time Calculation (min) 62 min   Activity Tolerance Patient tolerated treatment well      Past Medical History  Diagnosis Date  . Chicken pox   . Hypertension   . Hyperlipidemia   . Hidradenitis suppurativa   . Anemia     Past Surgical History  Procedure Laterality Date  . Hand surgery  1975  . Colonoscopy w/ biopsies and polypectomy    . Abdominal aortic aneurysm repair N/A 05/03/2014    Procedure: RESECTION AND GRAFTING ANEURYSM ABDOMINAL AORTIC WITH BILATERAL COMMON ILIAC GRAFTING USING 18X9 HEMASHIELD GRAFT;  Surgeon: Mal Misty, MD;  Location: [place];  Service: Vascular;  Laterality: N/A;    LMP 05/31/2011  Visit Diagnosis:  Bilateral low back pain without sciatica - Plan: PT plan of care cert/re-cert      Subjective Assessment - 02/27/15 1200    Symptoms Arrives 15 min late.  Sees Dr. Nelva Bush on 3/17. Still waiting for TENS. "I'm about the same."  I don't use the pain patch as much.  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  Stretches make me feel better (lumbar rotation /bridges).  I can walk a little longer 30-40 minutes comfortably   How long can you walk comfortably? 30-40 min   Currently in Pain? Yes   Pain Score 4    Pain Orientation Lower   Pain Frequency Constant   Aggravating Factors  shower and washing up increases to a 6 or 7.  I haven't lifted over 10-15# because doctor's restriction helping    Pain Relieving Factors stretches; heat/TENs          [facility] PT Assessment -  02/27/15 1216    Observation/Other Assessments   Oswestry Disability Index  improved to 40%   Strength   Strength Assessment Site Lumbar   Right Hip ABduction 4+/5   Left Hip ABduction 4+/5   Lumbar Flexion 4/5   Lumbar Extension 4/5                  [facility] Adult PT Treatment/Exercise - 02/27/15 1242    Lumbar Exercises: Machines for Strengthening   Leg Press 40# B 15x; 20# single leg 20x each   Lumbar Exercises: Standing   Functional Squats Limitations --  5# Vs, waist pass around, overhead triceps 1 min each   Other Standing Lumbar Exercises Free motion 14# lat pull downs 20x   Other Standing Lumbar Exercises Wall planks 10x; UE/LE lift 10x right/left   Lumbar Exercises: Seated   Other Seated Lumbar Exercises --  sit to stand no UEs 10x   Lumbar Exercises: Supine   Ab Set 5 reps   Bridge 10 reps   Moist Heat Therapy   Number Minutes Moist Heat 15 Minutes   Moist Heat Location --  lumbar supine   Electrical Stimulation   Electrical Stimulation Location Back   Electrical Stimulation Action IFC   Electrical Stimulation Parameters 21m   Electrical Stimulation Goals Pain  PT Short Term Goals - 02/27/15 1340    PT SHORT TERM GOAL #1   Title "Independent with initial HEP   Status Achieved   PT SHORT TERM GOAL #2   Title The patient will demonstrate improved body mechanics, including hip hingeing with 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    Time 8   Period Weeks   Status Partially Met   PT LONG TERM GOAL #3   Title Modified Oswestry score improved from 42% to 30% indicating improved function with less pain   Time 8   Period Weeks   Status On-going   PT LONG TERM GOAL #4   Title Transverse abdominals activation and core strength to 4-/5 needed for standing and walking for longer periods of time in the community and at work.    Status Achieved   PT LONG TERM GOAL #5   Title Patient will have adequate spinal and hip ROM needed to lift a 10# object from knee level with proper body mechanics.     Status Achieved   Additional Long Term Goals   Additional Long Term Goals Yes   PT LONG TERM GOAL #6   Title Patient will have LE strength to 5/5 needed to lifting 20# in preparation for return to work.     Time 6   Period Weeks   Status New   PT LONG TERM GOAL #7   Title Patient will have core strength to 5-/5 needed for standing 40-45 minutes in preparation for return to work.   Time 6   Period Weeks   Status New               Plan - 02/27/15 1336    Clinical Impression Statement Patient  with improvements in core strength and slight improvement in Modified Oswestry.  Patient is reluctant to finish with PT since she is not back to work yet which requires lifting and standing.  Agreed to extend treatment at a decreased frequency to focus on these deficits.  Additional goals set.     PT Next Visit Plan Decrease frequency to 1x/week for 4-6 weeks to address higher level core strengthening in standing and lifting practice.  Patient's self goals:  stand 40-45 min and lift 20#.  Discontinue TENS when patient gets her own unit.          Problem List Patient Active Problem List   Diagnosis Date Noted  . Aftercare following surgery of the circulatory system 11/10/2014  . Drainage  from wound 11/10/2014  . Lower abdominal pain 11/10/2014  . AAA (abdominal aortic aneurysm) without rupture 07/25/2014  . AAA (abdominal aortic aneurysm) 05/03/2014  . Abdominal aneurysm without mention  of rupture 04/25/2014  . Severe obesity (BMI >= 40) 04/17/2014  . Hyperlipidemia 10/22/2011  . Elevated blood pressure 10/22/2011  . Hidradenitis suppurativa 10/22/2011    Alvera Singh 02/27/2015, 1:49 PM  [facility] [address] [place], [place], [address] Phone: [phone]   Fax:  [phone]  Ruben Im, PT 02/27/2015 1:49 PM Phone: [phone] Fax: [phone]

## 2015-03-05 ENCOUNTER — Ambulatory Visit: Payer: 59 | Attending: Family Medicine | Admitting: Rehabilitation

## 2015-03-05 DIAGNOSIS — M545 Low back pain, unspecified: Secondary | ICD-10-CM

## 2015-03-05 NOTE — Therapy (Signed)
[place], [place], [address] Phone: [phone]   Fax:  [phone]  Physical Therapy Treatment  Patient Details  Name: Julie Holder MRN: [id number] Date of Birth: 03-20-1961 Referring Provider:  Eulas Post, MD  Encounter Date: 03/05/2015      PT End of Session - 03/05/15 1033    Visit Number 14   Number of Visits 20   Date for PT Re-Evaluation 04/10/15   PT Start Time 1324   PT Stop Time 1058   PT Time Calculation (min) 43 min      Past Medical History  Diagnosis Date  . Chicken pox   . Hypertension   . Hyperlipidemia   . Hidradenitis suppurativa   . Anemia     Past Surgical History  Procedure Laterality Date  . Hand surgery  1975  . Colonoscopy w/ biopsies and polypectomy    . Abdominal aortic aneurysm repair N/A 05/03/2014    Procedure: RESECTION AND GRAFTING ANEURYSM ABDOMINAL AORTIC WITH BILATERAL COMMON ILIAC GRAFTING USING 18X9 HEMASHIELD GRAFT;  Surgeon: Mal Misty, MD;  Location: [place];  Service: Vascular;  Laterality: N/A;    There were no vitals filed for this visit.  Visit Diagnosis:  Bilateral low back pain without sciatica                     [facility] Adult PT Treatment/Exercise - 03/05/15 1034    Lumbar Exercises: Standing   Lifting From waist;to overhead;20 reps  [id number] weighted blue ball pullovers and waist to shoule   Row Strengthening;Power tower;Both;20 reps   Theraband Level (Row) --  2plates   Shoulder Extension Strengthening;Power Tower;Both;20 reps;Other (comment)  2 plates.   Other Standing Lumbar Exercises 25# seated and standing LAT pull downs x 15 each with cues for technique and core contract    Other Standing Lumbar Exercises Standing rotational stabilization with red band in one UE pulling to the other with arms at 90 degrees. 10 pulls each way progressing from bilateral stance to staggered stance to SLS x 10 each   Lumbar  Exercises: Seated   Other Seated Lumbar Exercises sit to stand x 15  no UE   Knee/Hip Exercises: Standing   Forward Step Up Both;2 sets;10 reps;Step Height: 6"  with opp knee/hip flexion    SLS with Vectors and red band x 10 each, no UE support, cues for posture and core engagement.                   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    Time 8   Period Weeks   Status Partially Met   PT LONG TERM GOAL #3   Title Modified Oswestry score improved from 42% to 30% indicating improved function with less pain   Time 8   Period Weeks   Status On-going   PT LONG TERM GOAL #4   Title Transverse abdominals activation and core strength to 4-/5 needed for standing and walking for longer periods of time in the community and at work.    Status Achieved   PT LONG TERM GOAL #5   Title Patient will have adequate spinal and hip ROM needed to lift a 10# object from knee level with proper body mechanics.     Status Achieved   Additional Long Term Goals   Additional Long Term Goals Yes   PT LONG TERM GOAL #6   Title  Patient will have LE strength to 5/5 needed to lifting 20# in preparation for return to work.     Time 6   Period Weeks   Status New   PT LONG TERM GOAL #7   Title Patient will have core strength to 5-/5 needed for standing 40-45 minutes in preparation for return to work.   Time 6   Period Weeks   Status New               Plan - 03/05/15 1107    Clinical Impression Statement Good tolerance to standing exercises without increased pain.    PT Next Visit Plan Decrease frequency to 1x/week for 4-6 weeks to address higher level core strengthening in standing and lifting practice.  Patient's self goals:  stand 40-45 min and lift 20#.  Discontinue TENS when patient gets her own unit.          Problem List Patient Active Problem List   Diagnosis Date Noted  . Aftercare following surgery of the circulatory system 11/10/2014  . Drainage from wound 11/10/2014  . Lower abdominal pain 11/10/2014  . AAA (abdominal aortic aneurysm) without rupture 07/25/2014  . AAA (abdominal aortic aneurysm) 05/03/2014  . Abdominal aneurysm without mention of rupture 04/25/2014  . Severe obesity (BMI >= 40) 04/17/2014  . Hyperlipidemia 10/22/2011  . Elevated blood pressure 10/22/2011  . Hidradenitis suppurativa 10/22/2011    Dorene Ar, PTA 03/05/2015, 11:09 AM  [facility] [address] [place], [place], [address] Phone: [phone]   Fax:  [phone]

## 2015-03-13 ENCOUNTER — Ambulatory Visit: Payer: 59 | Admitting: Physical Therapy

## 2015-03-13 DIAGNOSIS — M545 Low back pain, unspecified: Secondary | ICD-10-CM

## 2015-03-13 NOTE — Therapy (Signed)
[place], [place], [address] Phone: [phone]   Fax:  [phone]  Physical Therapy Treatment  Patient Details  Name: Julie Holder MRN: [id number] Date of Birth: June 29, 1961 Referring Provider:  Eulas Post, MD  Encounter Date: 03/13/2015      PT End of Session - 03/13/15 1100    Visit Number 15   Number of Visits 20   Date for PT Re-Evaluation 04/10/15   PT Start Time 1015   PT Stop Time 1055   PT Time Calculation (min) 40 min   Activity Tolerance Patient tolerated treatment well      Past Medical History  Diagnosis Date  . Chicken pox   . Hypertension   . Hyperlipidemia   . Hidradenitis suppurativa   . Anemia     Past Surgical History  Procedure Laterality Date  . Hand surgery  1975  . Colonoscopy w/ biopsies and polypectomy    . Abdominal aortic aneurysm repair N/A 05/03/2014    Procedure: RESECTION AND GRAFTING ANEURYSM ABDOMINAL AORTIC WITH BILATERAL COMMON ILIAC GRAFTING USING 18X9 HEMASHIELD GRAFT;  Surgeon: Mal Misty, MD;  Location: [place];  Service: Vascular;  Laterality: N/A;    There were no vitals filed for this visit.  Visit Diagnosis:  Bilateral low back pain without sciatica      Subjective Assessment - 03/13/15 1023    Symptoms The doctor started me on a new pain med.  Won't get TENS unit April.     Currently in Pain? Yes   Pain Score 4    Pain Location Back   Pain Orientation Lower   Pain Type Chronic pain                       [facility] Adult PT Treatment/Exercise - 03/13/15 1026    Lumbar Exercises: Standing   Functional Squats --  resisted walking 1 black cord 5x 4 directions   Lifting From 12";10 reps  17#   Forward Lunge 10 reps  holding yellow plyo ball   Side Lunge 10 reps  holding yellow plyo ball   Push / Pull Sled Standing UBE 4 min   Wall Slides 10 reps   Row --  5# waist pass arounds, Vs and overhead 1 min each   Theraband Level (Shoulder Extension) --  hip extension red band 10x each   Other Standing Lumbar Exercises 20# standing LAT pull downs x 15 each with cues for technique and core contract    Other Standing Lumbar Exercises 4 inch step ups with  opposite hip flexion 10x each   Lumbar Exercises: Seated   Other Seated Lumbar Exercises sit to stand x 15  no UE holding                   PT Short Term Goals - 03/13/15 1851    PT SHORT TERM GOAL #1   Title "Independent with initial HEP   Status Achieved   PT SHORT TERM GOAL #2   Title The patient will demonstrate improved body mechanics, including hip hingeing with lifting 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   Time 4   Period Weeks   Status Partially Met           PT  Long Term Goals - 03/13/15 1851    PT LONG TERM GOAL #1   Time 8   Period Weeks   Status Partially Met   PT LONG TERM GOAL #2   Title "Pt will be independent with advanced HEP.    Time 8   Period Weeks   Status Partially Met   PT LONG TERM GOAL #3   Title Modified Oswestry score improved from 42% to 30% indicating improved function with less pain   Time 8   Period Weeks   Status On-going   PT LONG TERM GOAL #4   Title Transverse abdominals activation and core strength to 4-/5 needed for standing and walking for longer periods of time in the community and at work.    Status Achieved   PT LONG TERM GOAL #5   Title Patient will have adequate spinal and hip ROM needed to lift a 10# object from knee level with proper body mechanics.     Status Achieved   PT LONG TERM GOAL #6   Title Patient will have LE strength to 5/5 needed to lifting 20# in preparation for return to work.     Time 6   Period Weeks   Status On-going   PT LONG TERM GOAL #7   Title Patient will have core strength to 5-/5  needed for standing 40-45 minutes in preparation for return to work.   Time 6   Period Weeks   Status Partially Met               Plan - 03/13/15 1848    Clinical Impression Statement Patient able to stand 40 minutes continuosly and participate in lifting 17# from knee level to waist with only mild increase in pain 5/10.  She declines the need for modalities offered.  Verbal cuing for proper technique with exercises and hip hinging with sit to stand.  Progressing toward LTGs.   PT Next Visit Plan Continue intermediate level core and LE strengthening, standing and lifting practice.  Patient waiting for home TENS to arrive.  Modalities PRN.        Problem List Patient Active Problem List   Diagnosis Date Noted  . Aftercare following surgery of the circulatory system 11/10/2014  . Drainage from wound 11/10/2014  . Lower abdominal pain 11/10/2014  . AAA (abdominal aortic aneurysm) without rupture 07/25/2014  . AAA (abdominal aortic aneurysm) 05/03/2014  . Abdominal aneurysm without mention of rupture 04/25/2014  . Severe obesity (BMI >= 40) 04/17/2014  . Hyperlipidemia 10/22/2011  . Elevated blood pressure 10/22/2011  . Hidradenitis suppurativa 10/22/2011    Alvera Singh 03/13/2015, 6:53 PM  [facility] [address] [place], [place], [address] Phone: [phone]   Fax:  [phone] Ruben Im, PT 03/13/2015 6:53 PM Phone: [phone] Fax: [phone]

## 2015-03-19 ENCOUNTER — Ambulatory Visit: Payer: 59 | Admitting: Physical Therapy

## 2015-03-19 DIAGNOSIS — M545 Low back pain, unspecified: Secondary | ICD-10-CM

## 2015-03-19 NOTE — Therapy (Signed)
[place], [place], [address] Phone: [phone]   Fax:  [phone]  Physical Therapy Treatment  Patient Details  Name: Julie Holder MRN: [id number] Date of Birth: Jun 07, 1961 Referring Provider:  Eulas Post, MD  Encounter Date: 03/19/2015      PT End of Session - 03/19/15 1022    Visit Number 16   Number of Visits 20   Date for PT Re-Evaluation 04/10/15   PT Start Time 7341   PT Stop Time 1115   PT Time Calculation (min) 60 min      Past Medical History  Diagnosis Date  . Chicken pox   . Hypertension   . Hyperlipidemia   . Hidradenitis suppurativa   . Anemia     Past Surgical History  Procedure Laterality Date  . Hand surgery  1975  . Colonoscopy w/ biopsies and polypectomy    . Abdominal aortic aneurysm repair N/A 05/03/2014    Procedure: RESECTION AND GRAFTING ANEURYSM ABDOMINAL AORTIC WITH BILATERAL COMMON ILIAC GRAFTING USING 18X9 HEMASHIELD GRAFT;  Surgeon: Mal Misty, MD;  Location: [place];  Service: Vascular;  Laterality: N/A;    There were no vitals filed for this visit.  Visit Diagnosis:  Bilateral low back pain without sciatica      Subjective Assessment - 03/19/15 1021    Symptoms My pain increased to 6.5/10 after I left here last time. I had to use a pain patch.    Currently in Pain? Yes   Pain Score 5    Pain Location Back   Pain Orientation Lower   Pain Type Chronic pain   Aggravating Factors  bathing and getting dressed   Pain Relieving Factors rest                       [facility] Adult PT Treatment/Exercise - 03/19/15 1025    Lumbar Exercises: Aerobic   Stationary Bike Nu step Level 6 x 10 min   Lumbar Exercises: Machines for Strengthening   Other Lumbar Machine Exercise 20# Lat pull downs standing with core contract   Other Lumbar Machine Exercise high kneeling pulloevers with yellow plyoball x 10   Lumbar Exercises: Standing   Functional Squats 20 reps   Functional Squats Limitations while perfoming karate chops with yellow plyo ball   Lifting From 12";20 reps  17#   Lifting Weights (lbs) 17   Lifting Limitations increased pain last set   Forward Lunge 20 reps  holding yellow plyo ball   Forward Lunge Limitations alternating lunges   Side Lunge 20 reps  holding yellow plyo ball   Side Lunge Limitations alternating   Other Standing Lumbar Exercises standing mini rotations x 10 each way with 2 hands on red tband with straight elbows.    Other Standing Lumbar Exercises 6 inch step ups with  opposite hip flexion 10x each   Lumbar Exercises: Quadruped   Other Quadruped Lumbar Exercises modified childs pose seated in chair forward and lateral x 3 each.    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    Time 8   Period Weeks   Status Partially Met   PT LONG TERM GOAL #3   Title Modified Oswestry score improved from 42% to 30% indicating improved function with less pain   Time 8   Period Weeks   Status On-going   PT LONG TERM GOAL #4   Title Transverse abdominals activation and core strength to 4-/5 needed for standing and walking for longer periods of time in the community and at work.    Time 8   Period Weeks   Status Achieved   PT LONG TERM GOAL #5   Title Patient will have adequate spinal and hip ROM needed to lift a 10# object from knee level with proper body mechanics.     Time 8   Period Weeks   Status Achieved   PT LONG TERM GOAL #6   Title Patient will have LE strength to 5/5 needed to lifting 20# in preparation for return to work.     Time 6   Period Weeks   Status On-going   PT LONG TERM GOAL #7   Title Patient will have core strength to 5-/5 needed for standing 40-45 minutes in preparation for return to work.   Time 6   Period Weeks   Status Partially Met               Plan - 03/19/15 1103    Clinical Impression Statement Pt reports increased pain after last visit, releived by rest and pain patch. Pt tolerated similar standing core and LE strengthening exercises for 30 minmutes today with back pain increased up to 6.5/10. Ended session with seated lumbar stretches and HMP.    PT Next Visit Plan Continue intermediate level core and LE strengthening, standing and lifting practice.  Patient waiting for home TENS to arrive.  Modalities PRN.        Problem List Patient Active Problem List   Diagnosis Date Noted  . Aftercare following surgery of the circulatory system 11/10/2014  . Drainage from wound 11/10/2014  . Lower abdominal pain 11/10/2014  . AAA (abdominal aortic aneurysm) without rupture 07/25/2014  . AAA (abdominal aortic aneurysm) 05/03/2014  . Abdominal aneurysm without mention  of rupture 04/25/2014  . Severe obesity (BMI >= 40) 04/17/2014  . Hyperlipidemia 10/22/2011  . Elevated blood pressure 10/22/2011  . Hidradenitis suppurativa 10/22/2011    Dorene Ar,  PTA 03/19/2015, 11:04 AM  [place], [place], [address] Phone: [phone]   Fax:  [phone]

## 2015-03-20 ENCOUNTER — Ambulatory Visit: Payer: 59 | Admitting: Physical Therapy

## 2015-03-27 ENCOUNTER — Ambulatory Visit: Payer: 59 | Attending: Family Medicine | Admitting: Physical Therapy

## 2015-03-27 DIAGNOSIS — M545 Low back pain, unspecified: Secondary | ICD-10-CM

## 2015-03-28 NOTE — Therapy (Signed)
[place], [place], [address] Phone: [phone]   Fax:  [phone]  Physical Therapy Treatment  Patient Details  Name: Julie Holder MRN: [id number] Date of Birth: 11/21/1961 Referring Provider:  Eulas Post, MD  Encounter Date: 03/27/2015      PT End of Session - 03/27/15 1022    Visit Number 17   Number of Visits 20   Date for PT Re-Evaluation 04/10/15   PT Start Time 1552   PT Stop Time 1100   PT Time Calculation (min) 45 min      Past Medical History  Diagnosis Date  . Chicken pox   . Hypertension   . Hyperlipidemia   . Hidradenitis suppurativa   . Anemia     Past Surgical History  Procedure Laterality Date  . Hand surgery  1975  . Colonoscopy w/ biopsies and polypectomy    . Abdominal aortic aneurysm repair N/A 05/03/2014    Procedure: RESECTION AND GRAFTING ANEURYSM ABDOMINAL AORTIC WITH BILATERAL COMMON ILIAC GRAFTING USING 18X9 HEMASHIELD GRAFT;  Surgeon: Mal Misty, MD;  Location: [facility];  Service: Vascular;  Laterality: N/A;    There were no vitals filed for this visit.  Visit Diagnosis:  Bilateral low back pain without sciatica      Subjective Assessment - 03/27/15 1021    Subjective I walked 12 blocks and my pain increased to 8/10. It was too much. I have been walking 15 minutes at a time on the treamill.    Currently in Pain? Yes   Pain Score 4    Pain Location Back   Pain Orientation Lower   Pain Descriptors / Indicators Aching   Pain Type Chronic pain   Aggravating Factors  walking too much   Pain Relieving Factors pain patch                       [facility] Adult PT Treatment/Exercise - 03/27/15 1023    Lumbar Exercises: Aerobic   Stationary Bike T.M 1.5 mph, 1% grade x    Lumbar Exercises: Machines for Strengthening   Leg Press 40# B x20   Other Lumbar Machine Exercise x 20 20# Lat pull downs standing with core contract   Other Lumbar  Machine Exercise Free motion bicep curls 2 plates x 20 bilateral   Lumbar Exercises: Standing   Functional Squats 20 reps   Functional Squats Limitations holding yellow ball at chest   Push / Pull Sled Standing level 2.5  UBE 5 min   Row Strengthening;Power tower;Both;20 reps   Shoulder Extension Strengthening;Power Tower;Both;20 reps;Other (comment)  2 plates.   Other Standing Lumbar Exercises Standing 4way hip with red band x 10 each bilateral   Other Standing Lumbar Exercises 6 inch step ups with  opposite hip flexion 20x each   Lumbar Exercises: Seated   Other Seated Lumbar Exercises sit to stand x 15  no UE holding                   PT Short Term Goals - 03/13/15 1851    PT SHORT TERM GOAL #1   Title "Independent with initial HEP   Status Achieved   PT SHORT TERM GOAL #2   Title The patient will demonstrate improved body mechanics, including hip hingeing with lifting light objects 5# as needed for work duties in the [employer]   Status Achieved   PT [place] #3   Title Modifited  Oswestry score improved from 42% to 36% indicating improved function with less pain.   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    Time 8   Period Weeks   Status Partially Met   PT LONG TERM GOAL #3   Title Modified Oswestry score improved from 42% to 30% indicating improved function with less pain   Time 8   Period Weeks   Status On-going   PT LONG TERM GOAL #4   Title Transverse abdominals activation and core strength to 4-/5 needed for standing and walking for longer periods of time  in the community and at work.    Time 8   Period Weeks   Status Achieved   PT LONG TERM GOAL #5   Title Patient will have adequate spinal and hip ROM needed to lift a 10# object from knee level with proper body mechanics.     Time 8   Period Weeks   Status Achieved   PT LONG TERM GOAL #6   Title Patient will have LE strength to 5/5 needed to lifting 20# in preparation for return to work.     Time 6   Period Weeks   Status On-going   PT LONG TERM GOAL #7   Title Patient will have core strength to 5-/5 needed for standing 40-45 minutes in preparation for return to work.   Time 6   Period Weeks   Status Partially Met               Plan - 03/27/15 1053    Clinical Impression Statement Able to stand in kitchen for meal prep 40-45 minutes at a time without rest break.    PT Next Visit Plan Continue intermediate level core and LE strengthening, standing and lifting practice.  Patient waiting for home TENS to arrive.  Modalities PRN.        Problem List Patient Active Problem List   Diagnosis Date Noted  . Aftercare following surgery of the circulatory system 11/10/2014  . Drainage from wound 11/10/2014  . Lower abdominal pain 11/10/2014  . AAA (abdominal aortic aneurysm) without rupture 07/25/2014  . AAA (abdominal aortic aneurysm) 05/03/2014  . Abdominal aneurysm without mention of rupture 04/25/2014  . Severe obesity (BMI >= 40) 04/17/2014  . Hyperlipidemia 10/22/2011  . Elevated blood pressure 10/22/2011  . Hidradenitis suppurativa 10/22/2011    Dorene Ar 03/28/2015, 8:37 AM  [facility] [address] [place], [place], [address] Phone: [phone]   Fax:  [phone]

## 2015-04-03 ENCOUNTER — Ambulatory Visit: Payer: 59 | Admitting: Physical Therapy

## 2015-04-03 DIAGNOSIS — M545 Low back pain, unspecified: Secondary | ICD-10-CM

## 2015-04-03 NOTE — Therapy (Signed)
[place], [place], [address] Phone: [phone]   Fax:  [phone]  Physical Therapy Treatment  Patient Details  Name: Julie Holder MRN: [id number] Date of Birth: 07-02-1961 Referring Provider:  Eulas Post, MD  Encounter Date: 04/03/2015      PT End of Session - 04/03/15 1058    Visit Number 18   Number of Visits 20   Date for PT Re-Evaluation 04/10/15   PT Start Time 2637   PT Stop Time 1100   PT Time Calculation (min) 45 min   Activity Tolerance Patient tolerated treatment well      Past Medical History  Diagnosis Date  . Chicken pox   . Hypertension   . Hyperlipidemia   . Hidradenitis suppurativa   . Anemia     Past Surgical History  Procedure Laterality Date  . Hand surgery  1975  . Colonoscopy w/ biopsies and polypectomy    . Abdominal aortic aneurysm repair N/A 05/03/2014    Procedure: RESECTION AND GRAFTING ANEURYSM ABDOMINAL AORTIC WITH BILATERAL COMMON ILIAC GRAFTING USING 18X9 HEMASHIELD GRAFT;  Surgeon: Mal Misty, MD;  Location: [place];  Service: Vascular;  Laterality: N/A;    There were no vitals filed for this visit.  Visit Diagnosis:  Bilateral low back pain without sciatica      Subjective Assessment - 04/03/15 1017    Subjective States she is about the same.  Walking 15 min intervals.  States if she walks 30 min or exercises in PT for a long time, she pays for it later.  Going to get spinal injection 5/4.   How long can you walk comfortably? 15 min increments   Currently in Pain? Yes   Pain Score 4    Pain Location Back   Pain Orientation Lower   Pain Type Chronic pain   Pain Frequency Constant   Aggravating Factors  walking too much; standing too long; sitting too long then rising   Pain Relieving Factors favorite chair with extra support                       [facility] Adult PT Treatment/Exercise - 04/03/15 1023    Lumbar Exercises:  Aerobic   Stationary Bike Nu-Step L5  10 min   Lumbar Exercises: Machines for Strengthening   Cybex Lumbar Extension hip machine abd/extension 25# 15x each right/left   Leg Press 40# B x25   Other Lumbar Machine Exercise x 20 20# Lat pull downs standing with core contract   Lumbar Exercises: Standing   Wall Slides --  sit to stand with yellow plyo ball 25x   Row --  Free Motion tricep presses 7# both   Shoulder Extension --  14# both 25x   Other Standing Lumbar Exercises Single leg with UE green band diagonals 25x right/left   Other Standing Lumbar Exercises 6 inch step ups with  opposite hip flexion 10x each with balance 5 sec at top   Modalities   Modalities Moist Heat;Electrical Stimulation       Patient declines modalities today.           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    Time 8   Period Weeks   Status Partially Met   PT LONG TERM GOAL #3   Title Modified Oswestry score improved from 42% to 30% indicating improved function with less pain   Time 8   Period Weeks   Status On-going   PT LONG TERM GOAL #4   Title  Transverse abdominals activation and core strength to 4-/5 needed for standing and walking for longer periods of time in the community and at work.    Status Achieved   PT LONG TERM GOAL #5   Title Patient will have adequate spinal and hip ROM needed to lift a 10# object from knee level with proper body mechanics.     Status Achieved   PT LONG TERM GOAL #6   Title Patient will have LE strength to 5/5 needed to lifting 20# in preparation for return to work.     Time 6   Period Weeks   PT LONG TERM GOAL #7   Title Patient will have core strength to 5-/5 needed for standing 40-45 minutes in preparation for return to work.   Time 6   Period Weeks   Status Partially Met               Plan - 04/03/15 1047    Clinical Impression Statement Patient is close to meeting the majority of goals.  Good awareness of core/abdominal activation in standing.  Improving with functional strength although continues to be quite painful later.     PT Next Visit Plan Discharge from PT next visit.  Recheck Oswestry ; 20# lift;  review remaining goals;  give Discharge summary for patient to take to Dr. Nelva Bush; awaiting TENS delivery        Problem List Patient Active Problem List   Diagnosis Date Noted  . Aftercare following surgery of the circulatory system 11/10/2014  . Drainage from wound 11/10/2014  . Lower abdominal pain 11/10/2014  . AAA (abdominal aortic aneurysm) without rupture 07/25/2014  . AAA (abdominal aortic aneurysm) 05/03/2014  . Abdominal aneurysm without mention of rupture 04/25/2014  . Severe obesity (BMI >= 40) 04/17/2014  . Hyperlipidemia 10/22/2011  . Elevated blood pressure 10/22/2011  . Hidradenitis suppurativa 10/22/2011    Alvera Singh 04/03/2015, 11:02 AM  [facility] [address] [place], [place], [address] Phone: [phone]   Fax:  [phone]   Ruben Im, PT 04/03/2015 11:03 AM Phone:  [phone] Fax: [phone]

## 2015-04-10 ENCOUNTER — Ambulatory Visit: Payer: 59 | Admitting: Physical Therapy

## 2015-04-10 DIAGNOSIS — M545 Low back pain, unspecified: Secondary | ICD-10-CM

## 2015-04-10 NOTE — Therapy (Signed)
[place], [place], [address] Phone: [phone]   Fax:  [phone]  Physical Therapy Treatment/Discharge Summary  Patient Details  Name: Julie Holder MRN: [id number] Date of Birth: March 20, 1961 Referring Provider:  Eulas Post, MD/Dr. Nelva Bush  Encounter Date: 04/10/2015      PT End of Session - 04/10/15 1044    Visit Number 19   Number of Visits 20   Date for PT Re-Evaluation 04/10/15   PT Start Time 8144   PT Stop Time 1053   PT Time Calculation (min) 38 min   Activity Tolerance Patient limited by pain      Past Medical History  Diagnosis Date  . Chicken pox   . Hypertension   . Hyperlipidemia   . Hidradenitis suppurativa   . Anemia     Past Surgical History  Procedure Laterality Date  . Hand surgery  1975  . Colonoscopy w/ biopsies and polypectomy    . Abdominal aortic aneurysm repair N/A 05/03/2014    Procedure: RESECTION AND GRAFTING ANEURYSM ABDOMINAL AORTIC WITH BILATERAL COMMON ILIAC GRAFTING USING 18X9 HEMASHIELD GRAFT;  Surgeon: Mal Misty, MD;  Location: [place];  Service: Vascular;  Laterality: N/A;    There were no vitals filed for this visit.  Visit Diagnosis:  Bilateral low back pain without sciatica      Subjective Assessment - 04/10/15 1018    Subjective States she is still awaiting for her TENS unit and plans on calling today, may ask for her money back and try a different company.  I worked out on the treadmill in 10 min increments over the last week.     How long can you stand comfortably? 40 min   How long can you walk comfortably? 30 min max   Currently in Pain? Yes   Pain Score 4    Pain Location Back   Pain Orientation Lower   Pain Type Chronic pain   Pain Frequency Constant   Aggravating Factors  standing or walking too long   Pain Relieving Factors stretches; propping pillow in favorite chair and sit up            [facility] PT Assessment - 04/10/15  1019    Observation/Other Assessments   Oswestry Disability Index  44% slight decreased from 02/27/15   ROM / Strength   AROM / PROM / Strength AROM   AROM   AROM Assessment Site Lumbar   Lumbar Flexion 50   Lumbar Extension 30   Lumbar - Right Side Bend 30   Lumbar - Left Side Bend 30   Strength   Strength Assessment Site --  Able to squat fully to reach lower cabinets   Right Hip ABduction 5/5   Left Hip ABduction 5/5   Lumbar Flexion --  5-   Lumbar Extension --  5-                     [facility] Adult PT Treatment/Exercise - 04/10/15 1041    Lumbar Exercises: Aerobic   Stationary Bike Nu-Step L5  10 min   Lumbar Exercises: Machines for Strengthening   Leg Press 40# B x25   Lumbar Exercises: Standing   Lifting From 12";Weights   Lifting Weights (lbs) 20                  PT Short Term Goals - 04/10/15 1028    PT SHORT TERM GOAL #1   Title "Independent with  initial HEP   Status Achieved   PT SHORT TERM GOAL #2   Title The patient will demonstrate improved body mechanics, including hip hingeing with lifting 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    Status Achieved   PT LONG TERM GOAL #3   Title Modified Oswestry score improved from 42% to 30% indicating improved function with less pain   Status Not Met   PT LONG TERM GOAL #4   Title Transverse abdominals activation and core strength to 4-/5 needed for standing  and walking for longer periods of time in the community and at work.    Status Achieved   PT LONG TERM GOAL #5   Title Patient will have adequate spinal and hip ROM needed to lift a 10# object from knee level with proper body mechanics.     Status Achieved   PT LONG TERM GOAL #6   Title Patient will have LE strength to 5/5 needed to lifting 20# in preparation for return to work.     Status Achieved   PT LONG TERM GOAL #7   Title Patient will have core strength to 5-/5 needed for standing 40-45 minutes in preparation for return to work.   Status Achieved               Plan - 04/10/15 1032    Clinical Impression Statement Patient complaints of abdominal/side muscle spasms today with change of position.  Overall she has made good progress with goals including core strength, LE strength, postural awareness and body mechanics.  She is able to lift 20# from knee level to chest level with good form.  She is independent with HEP and safe self progression.  Although she continues to have pain she is highly motivated to continue increase her activity level  in increments to avoid exacerbating her pain.  Her Oswestry level is at moderate disabilty of 44%.  She has met the majority of goals.  Recommend discharge from PT at this time.  She would benefit from a home TENS unit for home pain control.  She attempted to purchase a TENS unit but the company has not delivered it after 2 1/2 months.       PT Next Visit Plan Discharge from PT at this time        Problem List Patient Active Problem List   Diagnosis Date Noted  . Aftercare following surgery of the circulatory system 11/10/2014  . Drainage from wound 11/10/2014  . Lower abdominal pain 11/10/2014  . AAA (abdominal aortic aneurysm) without rupture 07/25/2014  . AAA (abdominal aortic aneurysm) 05/03/2014  . Abdominal aneurysm without mention of rupture 04/25/2014  . Severe obesity (BMI >= 40) 04/17/2014  . Hyperlipidemia 10/22/2011  .  Elevated blood pressure 10/22/2011  . Hidradenitis suppurativa 10/22/2011    Ruben Im C 04/10/2015,  10:45 AM  [place], [place], [address] Phone: [phone]   Fax:  [phone]  PHYSICAL THERAPY DISCHARGE SUMMARY  Visits from Start of Care: 19  Current functional level related to goals / functional outcomes: See Clinical impressions above   Remaining deficits: Continued pain 4/10 on average limiting prolonged walking and standing.   Education / Equipment: Would benefit from a home TENS unit for pain control. Plan: Patient agrees to discharge.  Patient goals were partially met. Patient is being discharged due to meeting the stated rehab goals.  ?????   Ruben Im, PT 04/10/2015 10:47 AM Phone: [phone] Fax: [phone]

## 2015-04-23 ENCOUNTER — Encounter: Payer: Self-pay | Admitting: Family

## 2015-04-24 ENCOUNTER — Ambulatory Visit (HOSPITAL_COMMUNITY)
Admission: RE | Admit: 2015-04-24 | Discharge: 2015-04-24 | Disposition: A | Discharge status: Home / Self Care | Payer: 59 | Source: Ambulatory Visit | Attending: Vascular Surgery | Admitting: Vascular Surgery

## 2015-04-24 ENCOUNTER — Ambulatory Visit (INDEPENDENT_AMBULATORY_CARE_PROVIDER_SITE_OTHER): Payer: 59 | Admitting: Family

## 2015-04-24 ENCOUNTER — Encounter: Payer: Self-pay | Admitting: Family

## 2015-04-24 ENCOUNTER — Other Ambulatory Visit: Payer: Self-pay | Admitting: Vascular Surgery

## 2015-04-24 VITALS — BP 116/69 | HR 60 | Temp 97.6°F | Resp 16 | Ht 65.0 in | Wt 255.0 lb

## 2015-04-24 DIAGNOSIS — Z6841 Body Mass Index (BMI) 40.0 and over, adult: Secondary | ICD-10-CM | POA: Diagnosis not present

## 2015-04-24 DIAGNOSIS — Z87891 Personal history of nicotine dependence: Secondary | ICD-10-CM | POA: Diagnosis not present

## 2015-04-24 DIAGNOSIS — I714 Abdominal aortic aneurysm, without rupture, unspecified: Secondary | ICD-10-CM

## 2015-04-24 DIAGNOSIS — Z9889 Other specified postprocedural states: Secondary | ICD-10-CM

## 2015-04-24 DIAGNOSIS — Z48812 Encounter for surgical aftercare following surgery on the circulatory system: Secondary | ICD-10-CM

## 2015-04-24 DIAGNOSIS — R1084 Generalized abdominal pain: Secondary | ICD-10-CM | POA: Insufficient documentation

## 2015-04-24 NOTE — Addendum Note (Signed)
Addended by: Peter Minium K on: 04/24/2015 03:00 PM   Modules accepted: Orders

## 2015-04-24 NOTE — Progress Notes (Signed)
[facility] [place]  Established Open AAA Repair  History of Present Illness  Julie Holder is a 54 y.o. (Jan 29, 1961) female patient of Dr. Kellie Simmering who is S/P open AAA repair on 05-03-14. She returns today for follow up.  CT angiogram performed on Apr 28 2014:  Aneurysm measured 6.3 cm in maximum diameter and extended up to the renal arteries with a very short-less than 5 mm-neck below the renal arteries. 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 Patient denies any new symptoms of abdominal or back pain. She has no history of coronary artery disease, CVA, TIAs, diabetes mellitus.   She has not been working since December 2015 due to back issues, will start injections in her lumbar spine tomorrow with Dr. Nelva Bush. She has started walking on her treadmill in 10 minutes segments.   She has seen Dr. Delilah Shan for lumbar strain ( in the same practice as Dr. Nelva Bush), this is how her AAA was incidentally found as part of her back evaluation.  She has also had physical therapy.  Pt denies any history of stroke or TIA.  She describes radiculopathy type pain in left calf.   Pt Diabetic: No Pt smoker: former smoker, quit in 2010  Past Medical History  Diagnosis Date  . Chicken pox   . Hypertension   . Hyperlipidemia   . Hidradenitis suppurativa   . Anemia     Past Surgical History  Procedure Laterality Date  . Hand surgery  1975  . Colonoscopy w/ biopsies and polypectomy    . Abdominal aortic aneurysm repair N/A 05/03/2014    Procedure: RESECTION AND GRAFTING ANEURYSM ABDOMINAL AORTIC WITH BILATERAL COMMON ILIAC GRAFTING USING 18X9 HEMASHIELD GRAFT;  Surgeon: Mal Misty, MD;  Location: [place];  Service: Vascular;  Laterality: N/A;   Social History History   Social History  . Marital Status: Single    Spouse Name: N/A  . Number of Children: N/A  . Years of Education: N/A   Occupational  History  . Not on file.   Social History Main Topics  . Smoking status: Former Smoker -- 0.30 packs/day for 30 years    Types: Cigarettes    Quit date: 01/21/2009  . Smokeless tobacco: Never Used  . Alcohol Use: No  . Drug Use: No  . Sexual Activity: Not on file   Other Topics Concern  . Not on file   Social History Narrative   Family History Family History  Problem Relation Age of Onset  . Heart disease Mother     CHF  . Hyperlipidemia Mother   . Kidney disease Mother   . Diabetes Mother   . Hypertension Mother   . Hyperlipidemia Sister   . Hypertension Sister   . Hyperlipidemia Brother   . Diabetes Brother   . Hypertension Brother   . Colon cancer Neg Hx   . Cancer Brother   . Cancer Father   . Hypertension Father   . Hyperlipidemia Father   . Hyperlipidemia Son    Current Outpatient Prescriptions on File Prior to Visit  Medication Sig Dispense Refill  . cholecalciferol (VITAMIN D) 400 UNITS TABS Take 400 Units by mouth daily.    . Coenzyme Q10 (COQ10) 100 MG CAPS Take 100 mg by mouth 2 (two) times daily.    Marland Kitchen docusate sodium (COLACE) 100 MG capsule Take 100 mg by mouth 2 (two) times daily.    Marland Kitchen  etodolac (LODINE) 400 MG tablet as needed.  1  . lisinopril-hydrochlorothiazide (PRINZIDE,ZESTORETIC) 10-12.5 MG per tablet Take 1 tablet by mouth daily. 90 tablet 2  . methocarbamol (ROBAXIN) 500 MG tablet   0  . Omega-3 Fatty Acids (FISH OIL) 1200 MG CAPS Take 1,200 mg by mouth 2 (two) times daily.    . pravastatin (PRAVACHOL) 40 MG tablet Take 20 mg by mouth 2 (two) times daily.    . Red Yeast Rice 600 MG CAPS Take 600 mg by mouth 2 (two) times daily.    Marland Kitchen trimethoprim-polymyxin b (POLYTRIM) ophthalmic solution Place 1 drop into the right eye every 4 (four) hours. For 5 d 10 mL 0   No current facility-administered medications on file prior to visit.   Allergies  Allergen Reactions  . Amoxicillin Shortness Of Breath  . Fish Allergy Anaphylaxis, Itching and Rash     Shell fish  . Claravis [Isotretinoin]     Muscle spasms  . Penicillins     hives  . Statins Other (See Comments)    Muscle spasms  . Aspirin Other (See Comments)    unknown  . Tape Rash    ROS: See HPI for pertinent positives and negatives.    Physical Examination  Filed Vitals:   04/24/15 0946  BP: 116/69  Pulse: 60  Temp: 97.6 F (36.4 C)  TempSrc: Oral  Resp: 16  Height: 5\' 5"  (1.651 m)  Weight: 255 lb (115.667 kg)  SpO2: 100%   Body mass index is 42.43 kg/(m^2).  General: A&O x 3, WD, morbidly obese female.  Pulmonary: Sym exp, good air movt, CTAB, no rales, rhonchi, or wheezing.   Cardiac: RRR, Nl S1, S2, no detected murmur.   Carotid Bruits Right Left   Negative Negative   Aorta is not palpable. Radial pulses are 2+ and equal.   VASCULAR EXAM: Extremities without ischemic changes  without Gangrene; without open wounds.     LE Pulses Right Left   FEMORAL not palpable  not palpable    POPLITEAL not palpable  not palpable   POSTERIOR TIBIAL 2+ palpable  not palpable    DORSALIS PEDIS  ANTERIOR TIBIAL 2+ palpable  2+ palpable      Gastrointestinal: soft, non tender, -G/R, - HSM, - masses palpated, - CVAT B. No drainage from umbilicus, no open wounds. Large panus.  Musculoskeletal: M/S 5/5 throughout, Extremities without ischemic changes.  Neurologic: Pain and light touch intact in extremities, Motor exam as listed above.         Non-Invasive Vascular Imaging  ABI (Date: 04/24/2015)  Right: PTA: 1.02, DPA: 1.02, TBI: 0.75  with all triphasic waveforms.  Left: PTA: 1.00 , DPA: 0.94, TBI: 0.69 with all triphasic waveforms.   Medical Decision Making  Julie Holder is a 54 y.o. female who presents s/p open AAA repair.  Pt is  asymptomatic with normal ABI's, all triphasic waveforms. Continue graduated walking program she has started on her treadmill.   The next ABI will be scheduled for 12 months.   The patient will follow up with [place] in 12 months with these studies.  I discussed in depth with the patient the nature of atherosclerosis, and emphasized the importance of maximal medical management including strict control of blood pressure, blood glucose, and lipid levels, obtaining regular exercise, and cessation of smoking.    The patient is aware that without maximal medical management the underlying atherosclerotic disease process will progress, limiting the benefit of any interventions.   Thank  you for allowing [place] to participate in this patient's care.  Clemon Chambers, RN, MSN, FNP-C [facility] [place] Office: [phone]   Clinic Physician: Kellie Simmering   04/24/2015, 9:52 AM

## 2015-04-25 LAB — VAS US ABI WITH/WO TBI
LEFT TOE PRESSURE: 92 mmHg
LPOSTTIB: 133 mmHg
Left TBI: 0.69
Left anterior tibial: 125 mmHg
Left arm BP: 121 mmHg
RIGHT TOE PRESSUE: 100 mmHg
RPOSTTIB: 135 mmHg
Right TBI: 0.75
Right anterior tibial: 136 mmHg
Right arm BP: 133 mmHg

## 2015-05-10 ENCOUNTER — Ambulatory Visit (INDEPENDENT_AMBULATORY_CARE_PROVIDER_SITE_OTHER): Payer: 59 | Admitting: Family Medicine

## 2015-05-10 ENCOUNTER — Encounter: Payer: Self-pay | Admitting: Family Medicine

## 2015-05-10 VITALS — BP 130/78 | HR 59 | Temp 97.5°F | Ht 65.0 in | Wt 253.0 lb

## 2015-05-10 DIAGNOSIS — Z0000 Encounter for general adult medical examination without abnormal findings: Secondary | ICD-10-CM | POA: Diagnosis not present

## 2015-05-10 LAB — LIPID PANEL
CHOLESTEROL: 201 mg/dL — AB (ref 0–200)
HDL: 40.6 mg/dL (ref >39.00)
LDL Cholesterol: 146 mg/dL — ABNORMAL HIGH (ref 0–99)
NonHDL: 160.4
TRIGLYCERIDES: 70 mg/dL (ref 0.0–149.0)
Total CHOL/HDL Ratio: 5
VLDL: 14 mg/dL (ref 0.0–40.0)

## 2015-05-10 LAB — CBC WITH DIFFERENTIAL/PLATELET
BASOS ABS: 0 10*3/uL (ref 0.0–0.1)
Basophils Relative: 0.5 % (ref 0.0–3.0)
EOS ABS: 0.1 10*3/uL (ref 0.0–0.7)
Eosinophils Relative: 1.4 % (ref 0.0–5.0)
HCT: 30.3 % — ABNORMAL LOW (ref 36.0–46.0)
HEMOGLOBIN: 10.2 g/dL — AB (ref 12.0–15.0)
LYMPHS PCT: 36.3 % (ref 12.0–46.0)
Lymphs Abs: 2.7 10*3/uL (ref 0.7–4.0)
MCHC: 33.8 g/dL (ref 30.0–36.0)
MCV: 83.9 fl (ref 78.0–100.0)
MONOS PCT: 6.9 % (ref 3.0–12.0)
Monocytes Absolute: 0.5 10*3/uL (ref 0.1–1.0)
NEUTROS ABS: 4.1 10*3/uL (ref 1.4–7.7)
NEUTROS PCT: 54.9 % (ref 43.0–77.0)
Platelets: 374 10*3/uL (ref 150.0–400.0)
RBC: 3.62 Mil/uL — ABNORMAL LOW (ref 3.87–5.11)
RDW: 16.6 % — AB (ref 11.5–15.5)
WBC: 7.4 10*3/uL (ref 4.0–10.5)

## 2015-05-10 LAB — BASIC METABOLIC PANEL
BUN: 22 mg/dL (ref 6–23)
CHLORIDE: 99 meq/L (ref 96–112)
CO2: 28 meq/L (ref 19–32)
CREATININE: 0.93 mg/dL (ref 0.40–1.20)
Calcium: 9.7 mg/dL (ref 8.4–10.5)
GFR: 80.67 mL/min (ref >60.00)
GLUCOSE: 89 mg/dL (ref 70–99)
Potassium: 4.4 mEq/L (ref 3.5–5.1)
Sodium: 134 mEq/L — ABNORMAL LOW (ref 135–145)

## 2015-05-10 LAB — VITAMIN D 25 HYDROXY (VIT D DEFICIENCY, FRACTURES): VITD: 27.11 ng/mL — ABNORMAL LOW (ref 30.00–100.00)

## 2015-05-10 LAB — HEPATIC FUNCTION PANEL
ALK PHOS: 92 U/L (ref 39–117)
ALT: 18 U/L (ref 0–35)
AST: 16 U/L (ref 0–37)
Albumin: 4 g/dL (ref 3.5–5.2)
BILIRUBIN DIRECT: 0 mg/dL (ref 0.0–0.3)
BILIRUBIN TOTAL: 0.3 mg/dL (ref 0.2–1.2)
Total Protein: 8.6 g/dL — ABNORMAL HIGH (ref 6.0–8.3)

## 2015-05-10 LAB — TSH: TSH: 0.82 u[IU]/mL (ref 0.35–4.50)

## 2015-05-10 NOTE — Progress Notes (Signed)
Pre visit review using our clinic review tool, if applicable. No additional management support is needed unless otherwise documented below in the visit note. 

## 2015-05-10 NOTE — Progress Notes (Signed)
Subjective:    Patient ID: Julie Holder, female    DOB: 11-12-1961, 54 y.o.   MRN: [id number]  HPI  Patient seen for complete physical/well visit. Her chronic problems include history of morbid obesity, hypertension, hyperlipidemia, abdominal aortic aneurysm, and hidradenitis suppurativa.  She had surgery for abdominal aortic aneurysm last year and has done well since that time. She initially lost substantial amount of weight but has gained this back unfortunately. She's had some recent back difficulties which of limited her activity. She is followed by [facility] regarding her back situation. She had recent injection which has helped minimally.  Patient quit smoking about 20 years ago. Tetanus up-to-date. Colonoscopy up-to-date. Normal Pap smear 2 years ago. Low risk for abnormal Pap smear. Medications reviewed. Compliant with all.  Past Medical History  Diagnosis Date  . Chicken pox   . Hypertension   . Hyperlipidemia   . Hidradenitis suppurativa   . Anemia    Past Surgical History  Procedure Laterality Date  . Hand surgery  1975  . Colonoscopy w/ biopsies and polypectomy    . Abdominal aortic aneurysm repair N/A 05/03/2014    Procedure: RESECTION AND GRAFTING ANEURYSM ABDOMINAL AORTIC WITH BILATERAL COMMON ILIAC GRAFTING USING 18X9 HEMASHIELD GRAFT;  Surgeon: Mal Misty, MD;  Location: [facility];  Service: Vascular;  Laterality: N/A;  . Abdominal aortic aneurysm repair  05-03-14    Dr. Kellie Simmering, J. D.    reports that she quit smoking about 6 years ago. Her smoking use included Cigarettes. She has a 9 pack-year smoking history. She has never used smokeless tobacco. She reports that she does not drink alcohol or use illicit drugs. family history includes Cancer in her brother and father; Diabetes in her brother and mother; Heart disease in her mother; Hyperlipidemia in her brother, father, mother, sister, and son; Hypertension in her brother, father, mother, and sister;  Kidney disease in her mother. There is no history of Colon cancer. Allergies  Allergen Reactions  . Amoxicillin Shortness Of Breath  . Fish Allergy Anaphylaxis, Itching and Rash    Shell fish  . Claravis [Isotretinoin]     Muscle spasms  . Penicillins     hives  . Statins Other (See Comments)    Muscle spasms  . Aspirin Other (See Comments)    unknown  . Tape Rash     Review of Systems  Constitutional: Negative for fever, activity change, appetite change, fatigue and unexpected weight change.  HENT: Negative for ear pain, hearing loss, sore throat and trouble swallowing.   Eyes: Negative for visual disturbance.  Respiratory: Negative for cough, chest tightness, shortness of breath and wheezing.   Cardiovascular: Negative for chest pain, palpitations and leg swelling.  Gastrointestinal: Negative for abdominal pain, diarrhea, constipation and blood in stool.  Endocrine: Negative for polydipsia and polyuria.  Genitourinary: Negative for dysuria and hematuria.  Musculoskeletal: Negative for myalgias, back pain and arthralgias.  Skin: Negative for rash.  Neurological: Negative for dizziness, seizures, syncope, weakness, light-headedness and headaches.  Hematological: Negative for adenopathy.  Psychiatric/Behavioral: Negative for confusion and dysphoric mood.       Objective:   Physical Exam  Constitutional: She appears well-developed and well-nourished.  HENT:  Head: Normocephalic and atraumatic.  Right Ear: External ear normal.  Left Ear: External ear normal.  Mouth/Throat: Oropharynx is clear and moist.  Neck: Neck supple. No thyromegaly present.  Cardiovascular: Normal rate and regular rhythm.   Pulmonary/Chest: Effort normal and breath sounds normal. No respiratory  distress. She has no wheezes. She has no rales.  Abdominal: Soft. Bowel sounds are normal. She exhibits no distension and no mass. There is no tenderness. There is no rebound and no guarding.  Genitourinary:    Breasts are symmetric. No nipple inversion. She has cystic lesion left inferior breast laterally which is draining slightly consistent with hidradenitis lesion she's had previously.  She has some scarring of both breasts from previous cystic lesions. No distinct masses noted otherwise  Musculoskeletal: She exhibits no edema.          Assessment & Plan:  Complete physical. We discussed repeat Pap and she wishes to go to every 3 years as she is low risk with no recent abnormalities. Obtain screening lab work. Include 25-hydroxy vitamin D. Continue yearly mammograms. Colonoscopy up-to-date. Tetanus up-to-date. Continue yearly flu vaccine. She is strongly advised to lose some weight and establish more consistent exercise

## 2015-05-10 NOTE — Patient Instructions (Signed)
We will call you with lab results Lose some weight Continue with yearly flu vaccine. Pap smear by next year.

## 2015-05-14 ENCOUNTER — Encounter: Payer: Self-pay | Admitting: Family Medicine

## 2015-05-14 ENCOUNTER — Other Ambulatory Visit: Payer: Self-pay

## 2015-05-14 VITALS — BP 120/72 | HR 66 | Ht 65.0 in | Wt 256.4 lb

## 2015-05-14 DIAGNOSIS — E785 Hyperlipidemia, unspecified: Secondary | ICD-10-CM

## 2015-05-14 NOTE — Patient Instructions (Signed)
1. Plan to walk 30 minutes  3-4 times a week 2. Plan to continue to limit fried food and eat more fresh vegetables.   3. Plan to try My Fitness Pal to help lose weight 4. Plan to return to Link to Wellness on August 13, 2015 at 10 AM at [place] office

## 2015-05-14 NOTE — Patient Outreach (Signed)
[place] [facility]) Care Management  [facility] Care Manager  05/14/2015   Julie Holder July 15, 1961 [id number] Member seen by [facility] for self management program for hyperlipidemia Subjective: Member states that she saw Dr.Burchette last week but she has not gotten her results from her labs yet.  States that she continues to have lower back pain and she had an epidural injection a few weeks ago.  States that she did not get any relief from her pain from the injection.  States she has been trying to walk 3 times a week and she has increased the time up to 30 minutes.  States she has to stop when she starts to have back pain.  States she has tried yoga in the past but it gave her muscle spasms.  States she has not been eating fried foods and she is eating more vegetables.  States she would like to lose weight.  Objective:  Filed Vitals:   05/14/15 1005  BP: 120/72  Pulse: 66   Filed Weights   05/14/15 1005  Weight: 256 lb 6.4 oz (116.302 kg)    Current Medications:  Current Outpatient Prescriptions  Medication Sig Dispense Refill  . cholecalciferol (VITAMIN D) 400 UNITS TABS Take 400 Units by mouth daily.    . Coenzyme Q10 (COQ10) 100 MG CAPS Take 100 mg by mouth 2 (two) times daily.    Marland Kitchen docusate sodium (COLACE) 100 MG capsule Take 100 mg by mouth 2 (two) times daily.    Marland Kitchen lisinopril-hydrochlorothiazide (PRINZIDE,ZESTORETIC) 10-12.5 MG per tablet Take 1 tablet by mouth daily. 90 tablet 2  . methocarbamol (ROBAXIN) 500 MG tablet   0  . pravastatin (PRAVACHOL) 40 MG tablet Take 20 mg by mouth 2 (two) times daily.    . Lidocaine-Menthol 4-4 % PTCH Apply topically every 12 (twelve) hours as needed.    . Omega-3 Fatty Acids (FISH OIL) 1200 MG CAPS Take 1,200 mg by mouth 2 (two) times daily.    . Red Yeast Rice 600 MG CAPS Take 600 mg by mouth 2 (two) times daily.     No current facility-administered medications for this visit.    Functional Status:  In your present  state of health, do you have any difficulty performing the following activities: 05/14/2015  Hearing? N  Vision? N  Difficulty concentrating or making decisions? N  Walking or climbing stairs? N  Dressing or bathing? N  Doing errands, shopping? N    Fall/Depression Screening: PHQ 2/9 Scores 05/14/2015  PHQ - 2 Score 0   [facility] CM Care Plan Problem One        Patient Outreach from 05/14/2015 in [place] Problem One  Elevated lipids   Care Plan for Problem One  Active   [facility] Long Term Goal (31-90 days)  Member will show improvement in lipid numbers within the next 90 days   [facility] Long Term Goal Start Date  05/14/15   Interventions for Problem One Long Term Goal  Reinforced to follow a low sodium low fat diet, Reinforced to continue to eat more fresh fruits and vegetables, Instructed to try using My Fitness Pal on her phone to help count calories and lose weight, Reinforced to get regular exercise as her back pain will allow Reinforced to take cholesterol medications as ordered      Assessment: Member has increased walking up to 3 times a week for 30 minutes.  Reports she is watching her diet and not eating fried  foods.  Offered [facility] for weight lose counseling but declines at this time.  Tolerating statin since taking in divided dose.    Plan:  Plan to walk 30 minutes  3-4 times a week  Plan to continue to limit fried food and eat more fresh vegetables.    Plan to try My Fitness Pal to help lose weight     Plan to return to [facility] on August 13, 2015 at 10 AM at [place] office      Plan to close case after next visit if stable  Peter Garter RN, [facility] Care Management Coordinator-Link to [place] Management [phone]

## 2015-07-10 ENCOUNTER — Other Ambulatory Visit: Payer: Self-pay | Admitting: Family Medicine

## 2015-07-10 NOTE — Telephone Encounter (Signed)
Refill for one year 

## 2015-08-13 ENCOUNTER — Ambulatory Visit: Payer: Self-pay

## 2015-08-18 IMAGING — CT CT CTA ABD/PEL W/CM AND/OR W/O CM
2 of 7 series · 15 of 46 positions shown, 17 images · IV contrast (Omni 300)
Comparison: none

CLINICAL DATA: Abdominal aortic  aneurysm, preop planning

EXAM:
CT ANGIOGRAPHY ABDOMEN AND PELVIS
TECHNIQUE: Multidetector CT imaging of the abdomen and pelvis was performed
using the standard protocol during bolus administration of
intravenous contrast. Multiplanar reconstructed images including
MIPs were obtained and reviewed to evaluate the vascular anatomy.
CONTRAST:  100mL OMNIPAQUE IOHEXOL 350 MG/ML SOLN

[Series 4: arterial 2.0 i40f 2 · axial · arterial · 0.98mm/px · z∈[+672,+1112]mm · 12 of 242 slices shown, 14 images]
[im 11/242  soft-tissue]
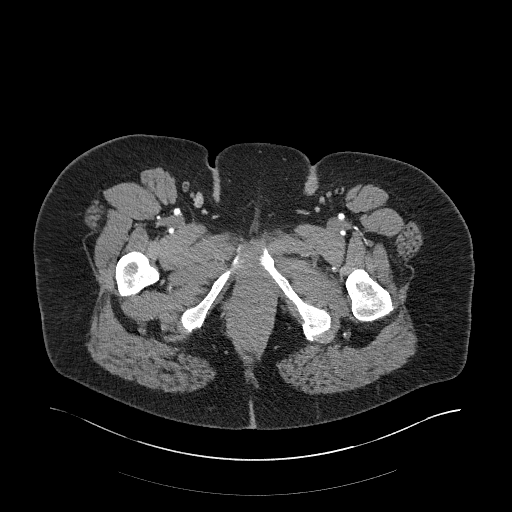
[im 11/242  bone]
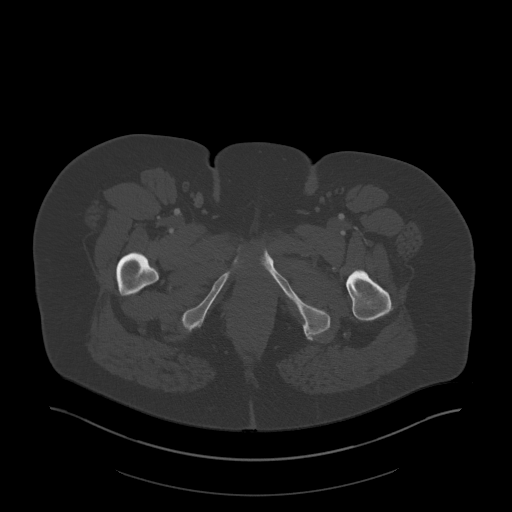
[im 33/242  soft-tissue]
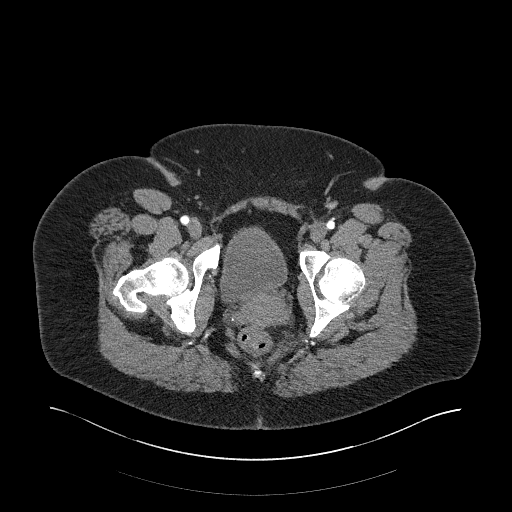
[im 55/242  soft-tissue]
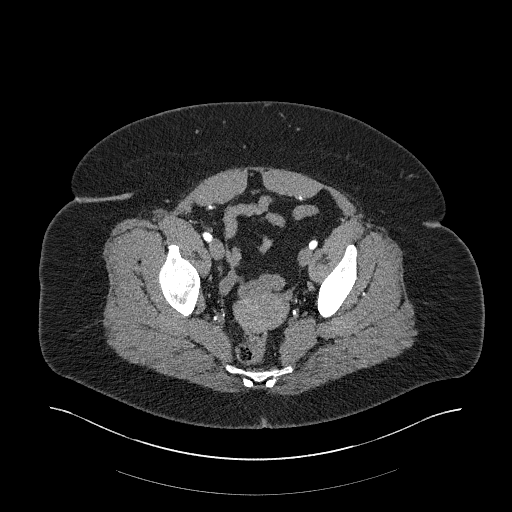
[im 77/242  soft-tissue]
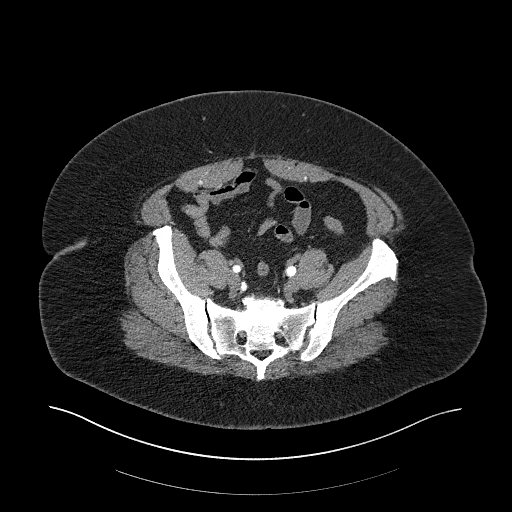
[im 88/242  soft-tissue]
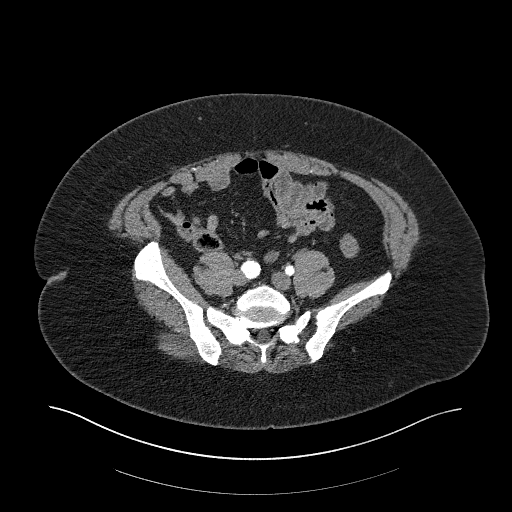
[im 110/242  soft-tissue]
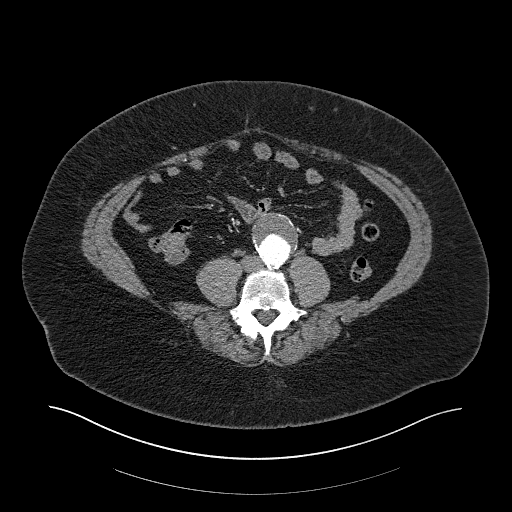
[im 132/242  soft-tissue]
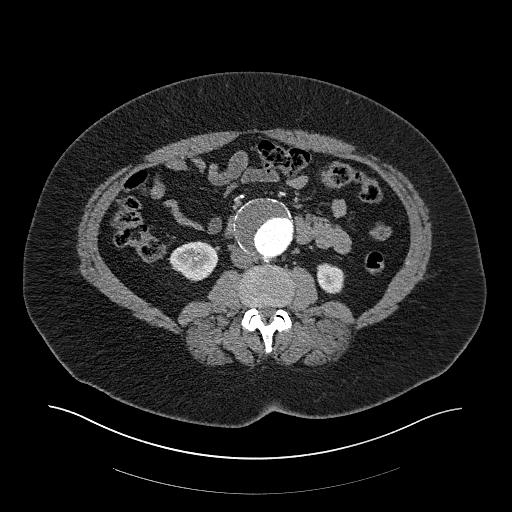
[im 154/242  soft-tissue]
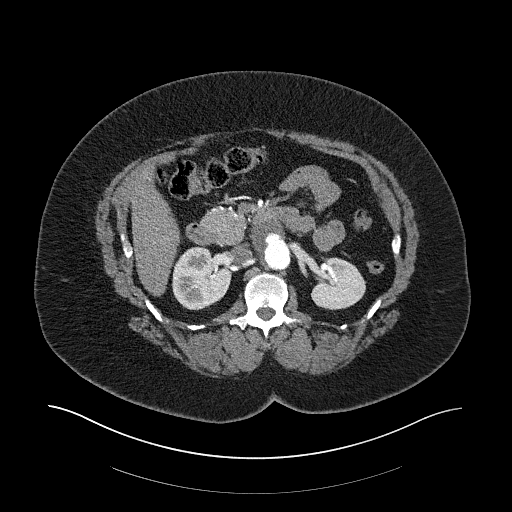
[im 165/242  soft-tissue]
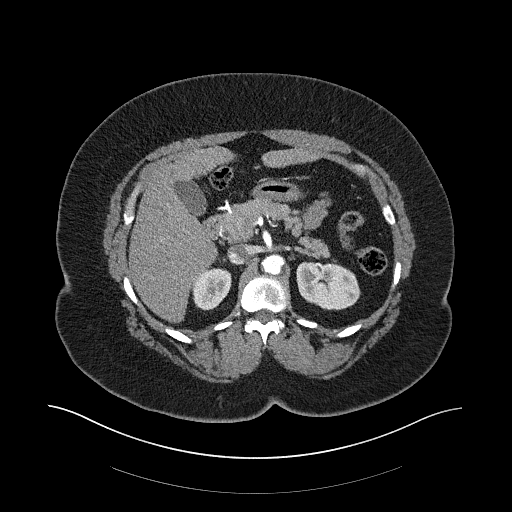
[im 165/242  bone]
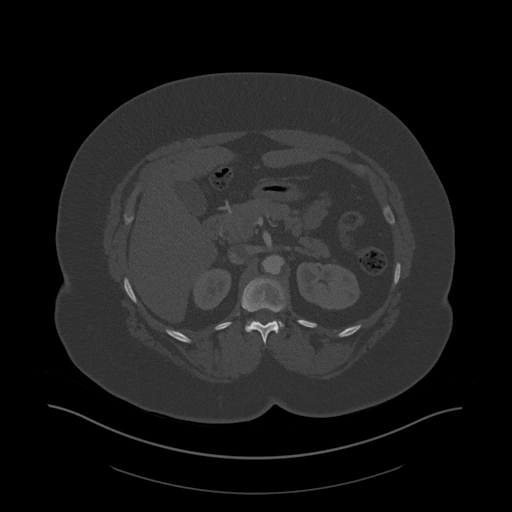
[im 187/242  soft-tissue]
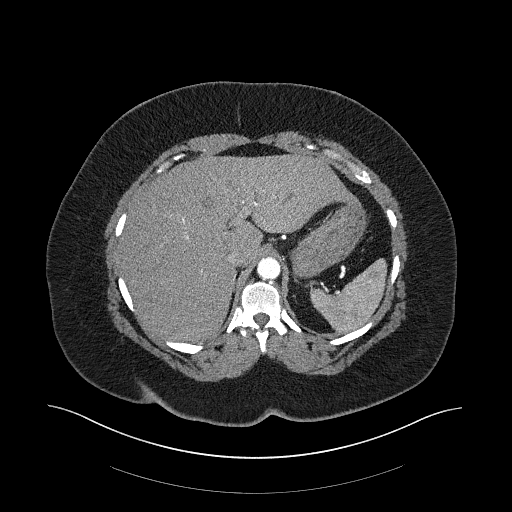
[im 209/242  soft-tissue]
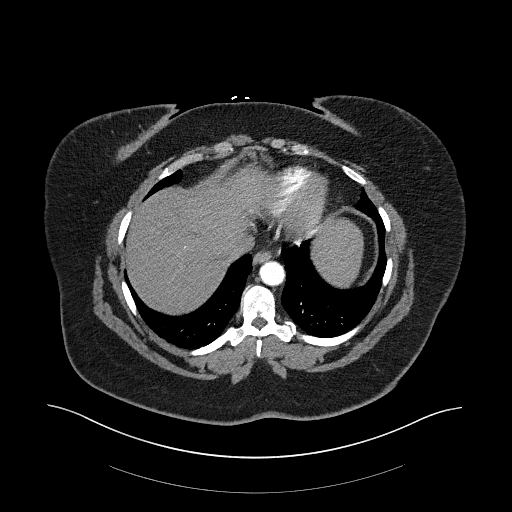
[im 231/242  soft-tissue]
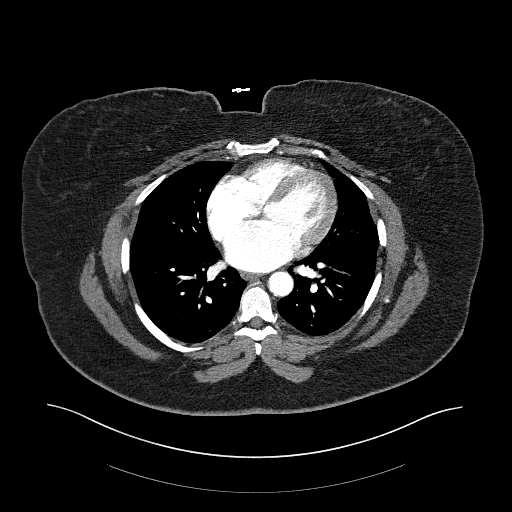

[Series 6: arterial 2.0 coronal · coronal · arterial · 0.85mm/px · 3 of 116 slices shown]
[im 29/116  soft-tissue]
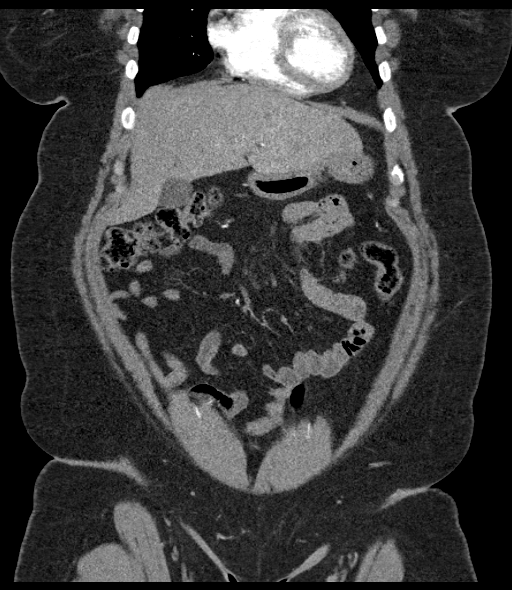
[im 58/116  soft-tissue]
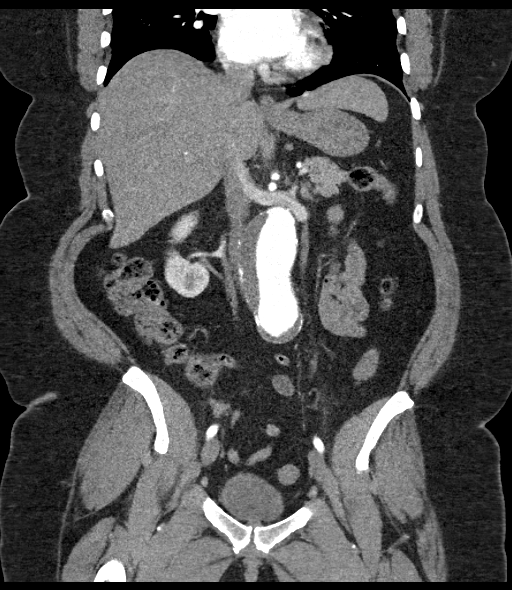
[im 87/116  soft-tissue]
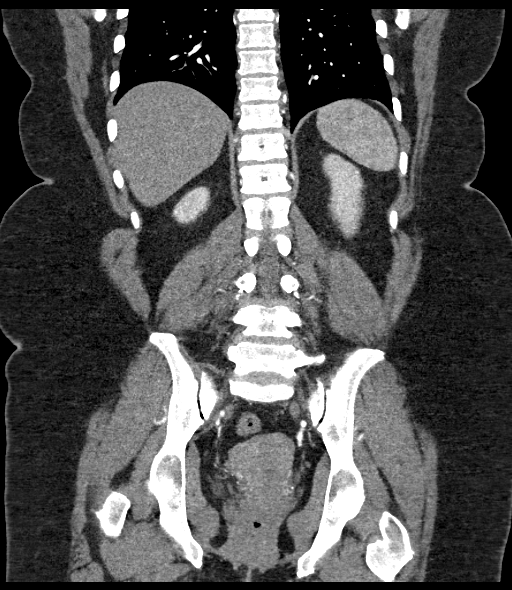

[15 of 46 positions shown; findings below may reference images not displayed]

FINDINGS: ARTERIAL FINDINGS:

Aorta: Visualized distal descending thoracic aorta unremarkable.
There is moderate partially calcified plaque/thrombus in the
suprarenal segment. There is a fusiform aneurysm of the infrarenal
segment with short infrarenal neck, 5.9 x 6.3 cm maximum transverse
dimensions with some eccentric mural thrombus. No dissection or
stenosis. Aorta tapers to a diameter of 2.9 cm just above the
bifurcation.

Celiac axis: Partially calcified ostial and proximal plaque
resulting in at least 50% diameter stenosis over a length of less
than 1 cm, patent distally.

Superior mesenteric:  Patent, with classic distal branch anatomy.

Left renal: Single, with partially calcified plaque extending over a
length of approximately 1.5 cm from the ostium resulting in at least
mild stenosis ; patent distally with a peripherally calcified 11 mm
aneurysm from the anterior division at the renal hilum.

Right renal:          Single, patent.

Inferior mesenteric: Short segment origin occlusion, reconstituted
distally by visceral collaterals.

Left iliac:           Non aneurysmal.  No dissection or stenosis.

Right iliac: Focal dilatation in the distal common iliac artery up
to 19 mm diameter, tapering to 13 mm just above its bifurcation.
External and internal iliac arteries unremarkable. No dissection or
stenosis.

Venous findings: Dedicated venous phase imaging not obtained. Patent
bilateral renal veins, IVC, and portal vein noted.

Review of the MIP images confirms the above findings.

Nonvascular findings: Minimal dependent atelectasis in the
visualized lung bases. Unremarkable arterial phase evaluation of
liver, spleen, nondistended gallbladder, adrenal glands, pancreas,
kidneys. No hydronephrosis. Stomach, small bowel and colon are
nondilated. Normal appendix. Urinary bladder incompletely distended.
Uterus and adnexal regions unremarkable. No ascites. No free air. No
adenopathy localized. Facet DJD in the lower lumbar spine.
IMPRESSION: 1. 6.3 cm fusiform infrarenal abdominal aortic aneurysm.
2. 19 mm focal dilatation of the distal RIGHT common iliac artery.
3. 11 mm left renal artery anterior division aneurysm.

## 2015-09-11 ENCOUNTER — Other Ambulatory Visit: Payer: Self-pay | Admitting: Family Medicine

## 2015-11-20 ENCOUNTER — Other Ambulatory Visit: Payer: Self-pay

## 2015-11-20 NOTE — Patient Outreach (Signed)
[facility]) Care Management  11/20/2015  Julie Holder 09-27-1961 [id number]  Phone call to schedule appointment.  No answer and message left.  Plan to send appointment request letter.  Peter Garter RN, [facility] Care Management Coordinator-Link to [place] Management [phone]

## 2015-12-11 ENCOUNTER — Other Ambulatory Visit: Payer: Self-pay

## 2015-12-11 NOTE — Patient Outreach (Signed)
[facility]) Care Management  12/11/2015  Julie Holder [birth year] [id number]  Member has not responded to appointment request letter sent 11/20/15 and phone message to schedule Link to Wellness appointment.  Members last Link to Wellness appointment was 05/14/15 and she did not show for scheduled appointment on 822/16.  Member has not adhered to attendance policy.  Case closed as member has withdrawn from the program.  Termination letter to be sent. Peter Garter RN, [facility] Care Management Coordinator-Link to [place] Management [phone]

## 2016-04-01 ENCOUNTER — Encounter: Payer: Self-pay | Admitting: Cardiology

## 2016-04-24 ENCOUNTER — Encounter: Payer: Self-pay | Admitting: Family

## 2016-04-29 ENCOUNTER — Inpatient Hospital Stay (HOSPITAL_COMMUNITY)
Admission: RE | Admit: 2016-04-29 | Discharge: 2016-04-29 | Disposition: A | Discharge status: Home / Self Care | Payer: Self-pay | Source: Ambulatory Visit | Attending: Family | Admitting: Family

## 2016-04-29 ENCOUNTER — Ambulatory Visit: Payer: Self-pay | Admitting: Family

## 2016-04-29 DIAGNOSIS — Z9889 Other specified postprocedural states: Secondary | ICD-10-CM
